# Patient Record
Sex: Female | Born: 1975
Health system: Southern US, Community
[De-identification: ages and names within clinical notes are randomized; demographics above are authoritative.]

## PROBLEM LIST (undated history)

## (undated) DIAGNOSIS — F209 Schizophrenia, unspecified: Secondary | ICD-10-CM

## (undated) DIAGNOSIS — J189 Pneumonia, unspecified organism: Secondary | ICD-10-CM

## (undated) DIAGNOSIS — F1910 Other psychoactive substance abuse, uncomplicated: Secondary | ICD-10-CM

## (undated) DIAGNOSIS — B1920 Unspecified viral hepatitis C without hepatic coma: Secondary | ICD-10-CM

## (undated) DIAGNOSIS — E119 Type 2 diabetes mellitus without complications: Secondary | ICD-10-CM

## (undated) DIAGNOSIS — F32A Depression, unspecified: Secondary | ICD-10-CM

## (undated) DIAGNOSIS — F419 Anxiety disorder, unspecified: Secondary | ICD-10-CM

## (undated) DIAGNOSIS — B999 Unspecified infectious disease: Secondary | ICD-10-CM

## (undated) DIAGNOSIS — F329 Major depressive disorder, single episode, unspecified: Secondary | ICD-10-CM

## (undated) DIAGNOSIS — G5600 Carpal tunnel syndrome, unspecified upper limb: Secondary | ICD-10-CM

## (undated) HISTORY — DX: Unspecified infectious disease: B99.9

## (undated) HISTORY — PX: CHOLECYSTECTOMY: SHX55

## (undated) HISTORY — DX: Depression, unspecified: F32.A

## (undated) HISTORY — PX: BACK SURGERY: SHX140

## (undated) HISTORY — DX: Major depressive disorder, single episode, unspecified: F32.9

## (undated) HISTORY — DX: Anxiety disorder, unspecified: F41.9

---

## 2005-03-02 ENCOUNTER — Ambulatory Visit (HOSPITAL_COMMUNITY): Admission: RE | Admit: 2005-03-02 | Discharge: 2005-03-03 | Payer: Self-pay | Admitting: Neurosurgery

## 2005-10-19 ENCOUNTER — Inpatient Hospital Stay (HOSPITAL_COMMUNITY): Admission: RE | Admit: 2005-10-19 | Discharge: 2005-10-24 | Payer: Self-pay | Admitting: Neurosurgery

## 2007-04-06 ENCOUNTER — Encounter
Admission: RE | Admit: 2007-04-06 | Discharge: 2007-04-21 | Payer: Self-pay | Admitting: Physical Medicine & Rehabilitation

## 2007-04-21 ENCOUNTER — Ambulatory Visit: Payer: Self-pay | Admitting: Physical Medicine & Rehabilitation

## 2009-11-13 ENCOUNTER — Ambulatory Visit: Payer: Self-pay | Admitting: Gastroenterology

## 2010-10-20 NOTE — Group Therapy Note (Signed)
REFERRING PHYSICIAN:  Danae Orleans. Venetia Maxon, M.D.   HISTORY:  A 35 year old female who states that she has a history of low  back pain dating to childhood.  She has had an L3-4 L4-5 right  microdiskectomy per Dr. Venetia Maxon for a herniated nucleus pulposus with  radiculopathy at that level.  She initially had good relief, but then  had a fall with re-herniation.  She then underwent an L3-4, L4-5  diskectomy, intradiskal spacing devices, and pedicle screw and rod  fixation L3 to L5 on Oct 19, 2005.  While her radicular pain has  improved, she has continued to have axial back pain.  She has been seen  by Dr. Venetia Maxon in follow-up as well as at [facility].  She  has not been to any pain management clinics.  She does not recall being  in any physical therapy postoperatively.  She was in [facility]  postoperatively.   She has been on Percocet 10/325 taking about three or four a day.  She  is really supposed to be taking three a day.  She has been on Lyrica 300  mg twice a day.  She has not had any spine injections postoperatively.   She grades her pain as 9/10.  Sleep is poor.  Pain is worse with  walking, bending, sitting, standing.  She can walk five minutes at a  time.  She walks without assistance.  She has been on disability for a  couple of years and states that she was put on it for her back pain.  She needs assistance with bathing, feeding, dressing, household duties,  and shopping.   REVIEW OF SYSTEMS:  Positive for depression, anxiety, trouble walking,  and weakness.   She has also had some weight loss.  She smokes two packs a day.  She  lives with her daughter who is 61 years old.   PAST SURGICAL HISTORY:  In addition to above, she has had  cholecystectomy in 2002.   FAMILY HISTORY:  Lung disease, diabetes, and psychiatric problems.   PHYSICAL EXAMINATION:  VITAL SIGNS:  Blood pressure 135/101, pulse 110,  respirations 20, O2 saturation 99% on room air.  ABDOMEN:  Positive bowel  sounds, soft, nontender, and no bruits.  GENERAL:  Well-developed, well-nourished female, orientation x3.  Affect  is bright.  Gait is normal.  She has no pain with internal or external  rotation of the hips.  She has full ankle and knee range of motion.  Upper extremity range of motion is normal.  Upper and lower extremity  strength is normal.  She does have some decreased full effort with knee  extension bilaterally due to back pain.  She has negative straight leg  raising.  Faber's test has some back pain, but this is more in the mid  spine area.  She has no tenderness over the spinous processes or over  the PSIS areas.   IMPRESSION:  1. Lumbar post laminectomy pain syndrome.  She has mainly axial pain,      question whether she may have some sacroiliac or facet involvement.      I have recommended diagnostic injections.  She is somewhat      apprehensive about this.  I will get urine drug screen today.  She      states that she took some type of medicine from a friend of hers      that will show positive as THC.  I question whether this was  Naranol and she did not remember the name of it and I cautioned her      that it is illegal to share controlled substances.  Also I      indicated that if she is testing positive for THC or other illicit      drugs, I will not be able to provide narcotic analgesic      prescription for her.  2. Send her to physical therapy in [facility].  3. Lidoderm patch.  4. Tramadol one to two tablets t.i.d. p.r.n. in place of the Percocet      for now.      Erick Colace, M.D.  Electronically Signed     AEK/MedQ  D:  04/21/2007 14:26:23  T:  04/22/2007 10:30:30  Job #:  [id number]   cc:   Danae Orleans. Venetia Maxon, M.D.  Fax: [phone]   Ulice Brilliant, M.D.

## 2010-10-23 NOTE — Op Note (Signed)
Veronica Thomas, Veronica Thomas                 ACCOUNT NO.:  [id number]   MEDICAL RECORD NO.:  [id number]          PATIENT TYPE:  AMB   LOCATION:  SDS                          FACILITY:  [facility]   PHYSICIAN:  Danae Orleans. Venetia Maxon, M.D.  DATE OF BIRTH:  07-02-1975   DATE OF PROCEDURE:  03/02/2005  DATE OF DISCHARGE:                                 OPERATIVE REPORT   PREOPERATIVE DIAGNOSIS:  Herniated lumbar disk at L3-4 and L4-5, right, with  spondylosis, degenerative disk disease and radiculopathy.   POSTOPERATIVE DIAGNOSIS:  Herniated lumbar disk at L3-4 and L4-5, right,  with spondylosis, degenerative disk disease and radiculopathy.   PROCEDURE:  Right L3-4 and L4-5 microdiskectomy with microdissection.   SURGEON:  Danae Orleans. Venetia Maxon, M.D.   ASSISTANT:  Stefani Dama, M.D.   ANESTHESIA:  General endotracheal anesthesia.   ESTIMATED BLOOD LOSS:  100 mL.   COMPLICATIONS:  None.   DISPOSITION:  To recovery.   INDICATIONS:  Veronica Thomas is a 35 year old young woman with a very large  herniated disk at L4-5 on the right, who has severe L5 radiculopathy.  Additionally, she has a large disk herniation at L3-4 on the right as well.  It was elected to take her to surgery for a microdiskectomy at these  affected levels.   PROCEDURE:  [name] was brought to the operating room.  Following the  satisfactory and uncomplicated induction of general endotracheal anesthesia  and placement of intravenous lines, the patient placed in the prone position  on the Wilson frame.  Her low back was then prepped and draped in the usual  sterile fashion.  The area of planned incision was infiltrated with 0.25%  Marcaine and 0.5% lidocaine and 1:200,000 epinephrine.  An incision was made  in the midline and carried through approximately two inches of adipose  tissue to the lumbar dorsal fascia, which was incised to the right side of  midline.  Subperiosteal dissection was performed exposing what was felt to  be the  L3-4 and L4-5 interspaces, and this was confirmed on intraoperative x-  ray.  Subsequently a hemisemilaminectomy of L4 and L3 were then performed on  the right side of midline with high-speed drill and completed with Kerrison  rongeurs.  Initially at the L4-5 level the ligamentum flavum was detached  and removed in a piecemeal fashion and under microdissection technique, the  L5 nerve root and thecal sac were brought medially exposing a free fragment  of herniated disk material which was quite large, which was caudad to the  interspace, with significant compression of the thecal sac and L5 nerve  root.  The herniated disk material was then removed in a piecemeal fashion.  The interspace was entered as the disk material appeared to emanate from the  interspace.  The interspace was aggressively cleared of residual disk  material using a variety of Epstein curettes and [employer] downgoing  curettes with up and down and straight-biting pituitary rongeurs.  After  this diskectomy portion of the procedure was completed at the L4-5 level, it  was felt that the interspace  was well-decompressed.  Attention was then  turned to the L3-4 level, where previous laminotomy had been performed.  The  thecal sac was mobilized.  The lateral aspect of the thecal sac appeared to  be tethered with scar tissue and a sclerotic disk herniation at the  interspace.  After mobilizing the thecal sac, the interspace was then  incised with a 15 blade and the disk material was removed in a piecemeal  fashion using again a variety of Epstein curettes, [employer]  downgoing curettes and a variety of pituitary rongeurs.  The interspace was  further evacuated of residual disk material and the thecal sac appeared to  be well-decompressed.  The wound was then copiously irrigated with  bacitracin and saline.  Soft tissues were inspected and found to be in good  repair.  The operative site was then bathed in 2 mL of  fentanyl and 80 mg of  Depo-Medrol.  The self-retaining retractor was removed, the microscope was  taken out of the field, and the lumbar dorsal fascia was closed with 0  Vicryl sutures and the subcutaneous tissue was reapproximated with 4-0  Vicryl interrupted, inverted sutures.  The skin edges were reapproximated  with interrupted 3-0 Vicryl subcuticular stitch.  The wound was dressed with  Dermabond.  The patient was extubated in the operating room and taken to the  recovery room in stable and satisfactory condition, having tolerated her  operation well.  Counts were correct at the end of the case.      Danae Orleans. Venetia Maxon, M.D.  Electronically Signed     JDS/MEDQ  D:  03/02/2005  T:  03/03/2005  Job:  [id number]

## 2010-10-23 NOTE — Discharge Summary (Signed)
[name], [name] NO.:  [id number]   MEDICAL RECORD NO.:  [id number]          PATIENT TYPE:  INP   LOCATION:  3015                         FACILITY:  [facility]   PHYSICIAN:  Stefani Dama, M.D.  DATE OF BIRTH:  16-Apr-1976   DATE OF ADMISSION:  10/19/2005  DATE OF DISCHARGE:  10/24/2005                                 DISCHARGE SUMMARY   ADMISSION DIAGNOSIS:  Recurrent herniated nucleus pulposus with spondylosis,  degenerative disk disease of L3-4 and L4-5, lumbar radiculopathy.   FINAL DIAGNOSES:  1.  Recurrent herniated nucleus pulposus with spondylosis, degenerative disk      disease of L3-4 and L4-5, lumbar radiculopathy.  2.  Morbid obesity.  3.  Adjustment disorder.   CONDITION ON DISCHARGE:  Stable.   HOSPITAL COURSE:  Ms. [name] is a 35 year old individual who was  admitted with back and lower extremity pain secondary to degenerative disk  disease and a recurrent disk herniation at the level of L4-L5.  The patient  was advised regarding surgical intervention and underwent posterior  interbody decompression and arthrodesis at L3-4 and L4-5 with pedicle screw  fixation from L3 to L5.   Postoperatively, the patient was encouraged to mobilize.  She was very slow  in doing so and required the use of a Dilaudid PCA for five days.  Initial  efforts at weaning her from the Dilaudid were unsuccessful.  The patient  appeared very poorly mobilized and very poorly motivated to increase her  ambulatory status overall.  The patient did require substantial  encouragement in efforts to mobilize herself independently.  She complained  of various problems with nursing staff and physical therapy and even  complained of Dr. Venetia Maxon and ultimately complained about my inattention to  her care.  I advised that by this time the patient should be off of  parenteral pain medication, she should be mobilizing independently, and  advised that she needed to be more involved  in her own self-care issues and  needed to motivate herself.  With this, the patient became rather angry and  distraught and wished to be discharged home.  PCA pump was discontinued  today.  The patient is started on oral Dilaudid.  If she tolerates this  medication, which I suspect she will, she will be given a prescription for  Dilaudid 2 mg, #40, without refills; Flexeril 10 mg one q.8h.  Her incision  is clean and dry.  Her motor function as observed by her gait today appears  intact in the major groups, including the iliopsoas, quads, tibialis  anterior, and the gastrocs.   FOLLOW UP:  She will be followed up by Dr. Maeola Harman in approximately two  weeks' time.      Stefani Dama, M.D.  Electronically Signed     HJE/MEDQ  D:  10/24/2005  T:  10/25/2005  Job:  [id number]

## 2010-10-23 NOTE — Op Note (Signed)
NAMECIELA, MAHAJAN NO.:  [id number]   MEDICAL RECORD NO.:  [id number]          PATIENT TYPE:  INP   LOCATION:  2899                         FACILITY:  [facility]   PHYSICIAN:  Danae Orleans. Venetia Maxon, M.D.  DATE OF BIRTH:  [birth year]   DATE OF PROCEDURE:  10/19/2005  DATE OF DISCHARGE:                                 OPERATIVE REPORT   PREOPERATIVE DIAGNOSIS:  Recurrent herniated lumbar disk with spondylosis,  degenerative disk disease and radiculopathy at L3-4 and L4-5 levels.   POSTOPERATIVE DIAGNOSIS:  Recurrent herniated lumbar disk with spondylosis,  degenerative disk disease and radiculopathy at L3-4 and L4-5 levels.   PROCEDURE:  Redo diskectomy, L3-4 and L4-5, with posterior lumbar interbody  fusion, L3-4 and L4-5, with morcelized bone autograft and Osteocel with  pedicle screw fixation and posterolateral arthrodesis.   SURGEON:  Danae Orleans. Venetia Maxon, M.D.   ASSISTANT:  Hilda Lias, M.D.   ANESTHESIA:  General endotracheal anesthesia.   ESTIMATED BLOOD LOSS:  900 mL with 500 mL of Cell Saver blood returned to  the patient.   COMPLICATIONS:  None.   DISPOSITION:  To Recovery.   INDICATION:  Shailah Gibbins is a 35 year old woman who had previously undergone  lumbar diskectomy.  She is a morbidly obese smoker.  She has had recurrent  disk herniation and complains of significant low back and lower extremity  pain.  It was elected to take her to surgery for redo decompression and  fusion at these affected levels.   DESCRIPTION OF PROCEDURE:  Ms. Walsworth was brought to the operating room.  Following the satisfactory and uncomplicated induction of general  endotracheal anesthesia and placement of intravenous line and a Foley  catheter, the patient was placed in a prone position on the operating room.  Her low back was then prepped and draped in the usual sterile fashion.  The  area of planned incision was infiltrated with 0.25% Marcaine and 0.5%  lidocaine and  1:200,000 epinephrine.  Incision was made and carried through  copious adipose tissue and the lumbodorsal fascia was incised bilaterally.  Subperiosteal dissection was performed, exposing the L3, L4 and L5  transverse processes bilaterally and laminar-facet complexes at L3-4 and L4-  5 levels.  Intraoperative x-ray confirmed correct orientation at these  levels.  Subsequently, redo laminectomy and diskectomy was performed on the  right at the L3-4 and L4-5 level.  Using loupe magnification and very  carefully dissecting through investing scar tissue, multiple fragments of  herniated disk material were removed from both the L3-4 and L4-5 levels.  Subsequently, on the left, laminectomy of L3 and L4 was then performed and  diskectomy was performed at this level as well.  Interbody trial spacers  were then placed, 10 mm in size, and a more thorough diskectomy was  performed, both from the right and from the left to thoroughly remove disk  material and strip cartilaginous endplates to bleeding bone.  After trial  sizing, 10-mm Peek interbody cages were selected, packed with morcelized  bone autograft and Osteocel, which were then tamped into position.  Additional bone  graft material was packed in the interspace at each level  and then a second 10-mm cage was packed with morcelized bone autograft and  inserted in the interspace at each of these levels.  The positioning of  implants were satisfactory on the AP and lateral fluoroscopy.  Subsequently,  a 6.5 x 40-mm screws were placed at L5 and L4 bilaterally without any  cutouts and then at the L3 level, 45-mm pedicle screws were placed.  The  transverse processes of L3, L4 and L5 were decorticated with a high-speed  drill and morcelized bone autograft and the remaining Osteocel was then  placed overlying the transverse processes at each of these levels.  Seventy-  millimeter pre-lordosed rods were then placed and locked down in situ at  each of  these levels over each screw and prior to placing the bone graft,  the wound was copiously irrigated with Bacitracin and saline.  Soft tissues  were inspected and found to be in good repair.  The self-retaining retractor  was removed.  The lumbodorsal fascia was closed with #1 Vicryl sutures,  subcutaneous tissues were reapproximated with 2-0 Vicryl interrupted and  inverted sutures and the skin edges were reapproximated with interrupted 3-0  Vicryl subcuticular stitch.  The wound was dressed with Benzoin, Steri-  Strips, Telfa gauze and tape.  The patient was extubated in the operating  room and taken to the recovery room in stable and satisfactory condition,  having tolerated her operation well.  Counts were correct at the end of the  case.      Danae Orleans. Venetia Maxon, M.D.  Electronically Signed     JDS/MEDQ  D:  10/19/2005  T:  10/20/2005  Job:  [id number]

## 2011-02-02 LAB — HEPATITIS B SURFACE ANTIGEN: Hepatitis B Surface Ag: NEGATIVE

## 2011-02-02 LAB — ANTIBODY SCREEN: Antibody Screen: NEGATIVE

## 2011-02-02 LAB — RPR: RPR: NONREACTIVE

## 2011-06-08 NOTE — L&D Delivery Note (Signed)
Delivery Note At 2:52 PM a viable female was delivered via  (Presentation: Right Occiput Anterior).  APGAR: , ; weight .   Placenta status: Intact, Spontaneous.  Cord: 3 vessels with the following complications: None.   Bleeding c/w partial placental separation immediately after birth. Grasped body of Duncan placenta in cx and delivered intact by controlled traction. Manual exploration x2 -> large clots. FF and hemostatic after repair.   Anesthesia: Epidural Intrathecal  Episiotomy: None Lacerations: 1st degree Suture Repair: 3.0 vicryl rapide Est. Blood Loss (mL): 500cc  Mom to postpartum.  Baby to nursery-stable.  Veronica Thomas 07/31/2011, 3:23 PM

## 2011-07-15 ENCOUNTER — Other Ambulatory Visit (HOSPITAL_COMMUNITY)
Admission: RE | Admit: 2011-07-15 | Discharge: 2011-07-15 | Disposition: A | Discharge status: Home / Self Care | Payer: Medicare Other | Source: Ambulatory Visit | Attending: Family Medicine | Admitting: Family Medicine

## 2011-07-15 ENCOUNTER — Ambulatory Visit (INDEPENDENT_AMBULATORY_CARE_PROVIDER_SITE_OTHER): Payer: Medicare Other | Admitting: Obstetrics & Gynecology

## 2011-07-15 VITALS — BP 120/85 | Temp 98.5°F | Ht 68.5 in | Wt 229.7 lb

## 2011-07-15 DIAGNOSIS — F489 Nonpsychotic mental disorder, unspecified: Secondary | ICD-10-CM

## 2011-07-15 DIAGNOSIS — F1910 Other psychoactive substance abuse, uncomplicated: Secondary | ICD-10-CM

## 2011-07-15 DIAGNOSIS — O99320 Drug use complicating pregnancy, unspecified trimester: Secondary | ICD-10-CM

## 2011-07-15 DIAGNOSIS — Z124 Encounter for screening for malignant neoplasm of cervix: Secondary | ICD-10-CM | POA: Insufficient documentation

## 2011-07-15 DIAGNOSIS — O99340 Other mental disorders complicating pregnancy, unspecified trimester: Secondary | ICD-10-CM

## 2011-07-15 DIAGNOSIS — Z7251 High risk heterosexual behavior: Secondary | ICD-10-CM

## 2011-07-15 DIAGNOSIS — Z113 Encounter for screening for infections with a predominantly sexual mode of transmission: Secondary | ICD-10-CM | POA: Insufficient documentation

## 2011-07-15 LAB — STREP B DNA PROBE: GBS: POSITIVE

## 2011-07-15 LAB — CBC
MCH: 29.7 pg (ref 26.0–34.0)
MCV: 84.3 fL (ref 78.0–100.0)
RBC: 4.85 MIL/uL (ref 3.87–5.11)
RDW: 13.9 % (ref 11.5–15.5)
WBC: 12.8 10*3/uL — ABNORMAL HIGH (ref 4.0–10.5)

## 2011-07-15 LAB — POCT URINALYSIS DIP (DEVICE)
Glucose, UA: NEGATIVE mg/dL
Hgb urine dipstick: NEGATIVE
Nitrite: NEGATIVE
Specific Gravity, Urine: >=1.03 (ref 1.005–1.030)
Urobilinogen, UA: 4 mg/dL — ABNORMAL HIGH (ref 0.0–1.0)

## 2011-07-15 NOTE — Progress Notes (Signed)
Addended by: Jill Side on: 07/15/2011 12:40 PM   Modules accepted: Orders
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
## 2011-07-15 NOTE — Progress Notes (Signed)
   Subjective:    Veronica Thomas is a [id number] [redacted]w[redacted]d being seen today for her first obstetrical visit.  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 Patient does intend to breast feed. Pregnancy history fully reviewed.  Patient reports nausea and occasional contractions.  Filed Vitals:   07/15/11 1126 07/15/11 1128  BP: 120/85   Temp: 98.5 F (36.9 C)   Height:  5' 8.5" (1.74 m)  Weight: 229 lb 11.2 oz (104.191 kg)     HISTORY: OB History    Grav Para Term Preterm Abortions TAB SAB Ect Mult Living   4 1 1  0 2 1 1  0 0 1     # Outc Date GA Lbr Len/2nd Wgt Sex Del Anes PTL Lv   1 TAB 1996           2 TRM 10/98 [redacted]w[redacted]d  8lb2oz(3.685kg) F SVD None  Yes   Comments: postpartum hemorrhage   3 SAB 2005           4 CUR              Past Medical History  Diagnosis Date  . Anxiety   . Depression   . Infection     hepatitis c   Past Surgical History  Procedure Date  . Cholecystectomy   . Back surgery     , hard ware L3,4,5   Family History  Problem Relation Age of Onset  . Diabetes Mother   . Diabetes Father      Exam    Uterine Size: 36 cm  Pelvic Exam:    Perineum: Hemorrhoids, Normal Perineum   Vulva: normal   Vagina:  normal mucosa, normal discharge   pH:    Cervix: no lesions   Adnexa: not evaluated   Bony Pelvis: average  System: Breast:  normal appearance, no masses or tenderness   Skin: normal coloration and turgor, no rashes    Neurologic: oriented, normal   Extremities: no musculoskeletal defects noted   HEENT    Mouth/Teeth mucous membranes moist, pharynx normal without lesions   Neck supple   Cardiovascular:    Respiratory:  appears well, vitals normal, no respiratory distress, acyanotic, normal RR   Abdomen: gravid not tender   Urinary: urethral meatus normal      Assessment:    Pregnancy: [id number] There is no problem list on file for this patient.       Plan:     Initial labs drawn. Prenatal vitamins. Problem  list reviewed and updated. Genetic Screening discussed Amniocentesis: declined.  Ultrasound discussed; fetal survey: declined.  Follow up in 1 weeks. 50% of 30 min visit spent on counseling and coordination of care.     ARNOLD,JAMES 07/15/2011

## 2011-07-15 NOTE — Patient Instructions (Signed)
Pregnancy - Third Trimester The third trimester of pregnancy (the last 3 months) is a period of the most rapid growth for you and your baby. The baby approaches a length of 20 inches and a weight of 6 to 10 pounds. The baby is adding on fat and getting ready for life outside your body. While inside, babies have periods of sleeping and waking, suck their thumbs, and hiccups. You can often feel small contractions of the uterus. This is false labor. It is also called Braxton-Hicks contractions. This is like a practice for labor. The usual problems in this stage of pregnancy include more difficulty breathing, swelling of the hands and feet from water retention, and having to urinate more often because of the uterus and baby pressing on your bladder.  PRENATAL EXAMS  Blood work may continue to be done during prenatal exams. These tests are done to check on your health and the probable health of your baby. Blood work is used to follow your blood levels (hemoglobin). Anemia (low hemoglobin) is common during pregnancy. Iron and vitamins are given to help prevent this. You may also continue to be checked for diabetes. Some of the past blood tests may be done again.   The size of the uterus is measured during each visit. This makes sure your baby is growing properly according to your pregnancy dates.   Your blood pressure is checked every prenatal visit. This is to make sure you are not getting toxemia.   Your urine is checked every prenatal visit for infection, diabetes and protein.   Your weight is checked at each visit. This is done to make sure gains are happening at the suggested rate and that you and your baby are growing normally.   Sometimes, an ultrasound is performed to confirm the position and the proper growth and development of the baby. This is a test done that bounces harmless sound waves off the baby so your caregiver can more accurately determine due dates.   Discuss the type of pain  medication and anesthesia you will have during your labor and delivery.   Discuss the possibility and anesthesia if a Cesarean Section might be necessary.   Inform your caregiver if there is any mental or physical violence at home.  Sometimes, a specialized non-stress test, contraction stress test and biophysical profile are done to make sure the baby is not having a problem. Checking the amniotic fluid surrounding the baby is called an amniocentesis. The amniotic fluid is removed by sticking a needle into the belly (abdomen). This is sometimes done near the end of pregnancy if an early delivery is required. In this case, it is done to help make sure the baby's lungs are mature enough for the baby to live outside of the womb. If the lungs are not mature and it is unsafe to deliver the baby, an injection of cortisone medication is given to the mother 1 to 2 days before the delivery. This helps the baby's lungs mature and makes it safer to deliver the baby. CHANGES OCCURING IN THE THIRD TRIMESTER OF PREGNANCY Your body goes through many changes during pregnancy. They vary from person to person. Talk to your caregiver about changes you notice and are concerned about.  During the last trimester, you have probably had an increase in your appetite. It is normal to have cravings for certain foods. This varies from person to person and pregnancy to pregnancy.   You may begin to get stretch marks on your hips,   abdomen, and breasts. These are normal changes in the body during pregnancy. There are no exercises or medications to take which prevent this change.   Constipation may be treated with a stool softener or adding bulk to your diet. Drinking lots of fluids, fiber in vegetables, fruits, and whole grains are helpful.   Exercising is also helpful. If you have been very active up until your pregnancy, most of these activities can be continued during your pregnancy. If you have been less active, it is helpful  to start an exercise program such as walking. Consult your caregiver before starting exercise programs.   Avoid all smoking, alcohol, un-prescribed drugs, herbs and "street drugs" during your pregnancy. These chemicals affect the formation and growth of the baby. Avoid chemicals throughout the pregnancy to ensure the delivery of a healthy infant.   Backache, varicose veins and hemorrhoids may develop or get worse.   You will tire more easily in the third trimester, which is normal.   The baby's movements may be stronger and more often.   You may become short of breath easily.   Your belly button may stick out.   A yellow discharge may leak from your breasts called colostrum.   You may have a bloody mucus discharge. This usually occurs a few days to a week before labor begins.  HOME CARE INSTRUCTIONS   Keep your caregiver's appointments. Follow your caregiver's instructions regarding medication use, exercise, and diet.   During pregnancy, you are providing food for you and your baby. Continue to eat regular, well-balanced meals. Choose foods such as meat, fish, milk and other low fat dairy products, vegetables, fruits, and whole-grain breads and cereals. Your caregiver will tell you of the ideal weight gain.   A physical sexual relationship may be continued throughout pregnancy if there are no other problems such as early (premature) leaking of amniotic fluid from the membranes, vaginal bleeding, or belly (abdominal) pain.   Exercise regularly if there are no restrictions. Check with your caregiver if you are unsure of the safety of your exercises. Greater weight gain will occur in the last 2 trimesters of pregnancy. Exercising helps:   Control your weight.   Get you in shape for labor and delivery.   You lose weight after you deliver.   Rest a lot with legs elevated, or as needed for leg cramps or low back pain.   Wear a good support or jogging bra for breast tenderness during  pregnancy. This may help if worn during sleep. Pads or tissues may be used in the bra if you are leaking colostrum.   Do not use hot tubs, steam rooms, or saunas.   Wear your seat belt when driving. This protects you and your baby if you are in an accident.   Avoid raw meat, cat litter boxes and soil used by cats. These carry germs that can cause birth defects in the baby.   It is easier to loose urine during pregnancy. Tightening up and strengthening the pelvic muscles will help with this problem. You can practice stopping your urination while you are going to the bathroom. These are the same muscles you need to strengthen. It is also the muscles you would use if you were trying to stop from passing gas. You can practice tightening these muscles up 10 times a set and repeating this about 3 times per day. Once you know what muscles to tighten up, do not perform these exercises during urination. It is more likely   to cause an infection by backing up the urine.   Ask for help if you have financial, counseling or nutritional needs during pregnancy. Your caregiver will be able to offer counseling for these needs as well as refer you for other special needs.   Make a list of emergency phone numbers and have them available.   Plan on getting help from family or friends when you go home from the hospital.   Make a trial run to the hospital.   Take prenatal classes with the father to understand, practice and ask questions about the labor and delivery.   Prepare the baby's room/nursery.   Do not travel out of the city unless it is absolutely necessary and with the advice of your caregiver.   Wear only low or no heal shoes to have better balance and prevent falling.  MEDICATIONS AND DRUG USE IN PREGNANCY  Take prenatal vitamins as directed. The vitamin should contain 1 milligram of folic acid. Keep all vitamins out of reach of children. Only a couple vitamins or tablets containing iron may be fatal  to a baby or young child when ingested.   Avoid use of all medications, including herbs, over-the-counter medications, not prescribed or suggested by your caregiver. Only take over-the-counter or prescription medicines for pain, discomfort, or fever as directed by your caregiver. Do not use aspirin, ibuprofen (Motrin, Advil, Nuprin) or naproxen (Aleve) unless OK'd by your caregiver.   Let your caregiver also know about herbs you may be using.   Alcohol is related to a number of birth defects. This includes fetal alcohol syndrome. All alcohol, in any form, should be avoided completely. Smoking will cause low birth rate and premature babies.   Street/illegal drugs are very harmful to the baby. They are absolutely forbidden. A baby born to an addicted mother will be addicted at birth. The baby will go through the same withdrawal an adult does.  SEEK MEDICAL CARE IF: You have any concerns or worries during your pregnancy. It is better to call with your questions if you feel they cannot wait, rather than worry about them. DECISIONS ABOUT CIRCUMCISION You may or may not know the sex of your baby. If you know your baby is a boy, it may be time to think about circumcision. Circumcision is the removal of the foreskin of the penis. This is the skin that covers the sensitive end of the penis. There is no proven medical need for this. Often this decision is made on what is popular at the time or based upon religious beliefs and social issues. You can discuss these issues with your caregiver or pediatrician. SEEK IMMEDIATE MEDICAL CARE IF:   An unexplained oral temperature above 102 F (38.9 C) develops, or as your caregiver suggests.   You have leaking of fluid from the vagina (birth canal). If leaking membranes are suspected, take your temperature and tell your caregiver of this when you call.   There is vaginal spotting, bleeding or passing clots. Tell your caregiver of the amount and how many pads are  used.   You develop a bad smelling vaginal discharge with a change in the color from clear to white.   You develop vomiting that lasts more than 24 hours.   You develop chills or fever.   You develop shortness of breath.   You develop burning on urination.   You loose more than 2 pounds of weight or gain more than 2 pounds of weight or as suggested by your   caregiver.   You notice sudden swelling of your face, hands, and feet or legs.   You develop belly (abdominal) pain. Round ligament discomfort is a common non-cancerous (benign) cause of abdominal pain in pregnancy. Your caregiver still must evaluate you.   You develop a severe headache that does not go away.   You develop visual problems, blurred or double vision.   If you have not felt your baby move for more than 1 hour. If you think the baby is not moving as much as usual, eat something with sugar in it and lie down on your left side for an hour. The baby should move at least 4 to 5 times per hour. Call right away if your baby moves less than that.   You fall, are in a car accident or any kind of trauma.   There is mental or physical violence at home.  Document Released: 05/18/2001 Document Revised: 02/03/2011 Document Reviewed: 11/20/2008 [facility] Patient Information 2012 [place], [place]. Pregnancy and Smoking Smoking during pregnancy is very unhealthy for the mother and the developing fetus. The addictive drug in cigarettes (nicotine), carbon monoxide, and many other poisons are inhaled from a cigarette and are carried through your bloodstream to your fetus. Cigarette smoke contains more than 2,500 chemicals. It is not known which of these chemicals are harmful to the developing fetus. However, both nicotine and carbon monoxide play a role in causing health problems in pregnancy. Effects on the fetus of smoking during pregnancy:  Decrease in blood flow and oxygen to the uterus, placenta, and your fetus.   Increased heart  rate of the fetus.   Slowing of your fetus's growth in the uterus (intrauterine growth retardation).   Placental problems. Placenta may partially cover or completely cover the opening to the cervix (placenta previa), or the placenta may partially or completely separate from the uterus (placental abruption).   Increase risk of pregnancy outside of the uterus (tubal pregnancy).   Premature rupture membranes, causing the sac that holds the fetus to break too early, resulting in premature birth and increased health risks to the newborn.   Increased risk of birth defects, including heart defects.   Increased risk of miscarriage.  Newborns born to women who smoke during pregnancy:  Are more likely to be born too early (prematurely).   Are more likely to be at a low birth weight.   Are at risk for serious health problems, chronic or lifelong disabilities (cerebral palsy, mental retardation, learning problems), and possibly even death   Are at risk of Sudden Infant Death Syndrome (SIDS).   Have higher rates of miscarriage and stillbirth.   Have more lung and breathing (respiratory) problems.  Long-term effects on a child's behavior: Some of the following trends are seen with children of smoking mothers:  Increased risk for drug abuse, behavior, and conduct disorders.   Increased risk for smoking in adolescent girls.   Increased risk for negative behavior in 2-year-olds.   Increase risk for asthma, colic, and childhood obesity, which can lead to diabetes.   Increased risk for finger and toe disorders.  Resources to stop smoking during pregnancy:  Counseling.   Psychological treatment.   Acupuncture.   Family intervention.   Hypnosis.   Medicines that are safe to take during pregnancy. Nicotine supplements have not been studied enough. They should only be considered when all other methods fail.   Telephone QUIT lines.  Smoking and Breastfeeding:  Nicotine gets passed to  the infant through a  mother's breastmilk. This can cause nausea, colic, cramping, and diarrhea in the infant.   Smoking may reduce milk supply and interfere with the let-down response.   Even formula-fed infants of mothers who smoke have nicotine and cotinine (nicotine by-product) in their urine.  Other resources to help stop smoking:  American Cancer Society: www.cancer.org   American Heart Association: www.americanheart.org   National Cancer Institute: www.cancer.gov   Smoke Free Families: www.smokefreefamilies.[address] [place]): 769-703-5877 START  Document Released: 10/05/2004 Document Revised: 02/03/2011 Document Reviewed: 03/05/2009 [facility] Patient Information 2012 [place], [place]. Breastfeeding BENEFITS OF BREASTFEEDING For the baby  The first milk (colostrum) helps the baby's digestive system function better.   There are antibodies from the mother in the milk that help the baby fight off infections.   The baby has a lower incidence of asthma, allergies, and SIDS (sudden infant death syndrome).   The nutrients in breast milk are better than formulas for the baby and helps the baby's brain grow better.   Babies who breastfeed have less gas, colic, and constipation.  For the mother  Breastfeeding helps develop a very special bond between mother and baby.   It is more convenient, always available at the correct temperature and cheaper than formula feeding.   It burns calories in the mother and helps with losing weight that was gained during pregnancy.   It makes the uterus contract back down to normal size faster and slows bleeding following delivery.   Breastfeeding mothers have a lower risk of developing breast cancer.  NURSE FREQUENTLY  A healthy, full-term baby may breastfeed as often as every hour or space his or her feedings to every 3 hours.   How often to nurse will vary from baby to baby. Watch your baby for signs of hunger, not the clock.   Nurse  as often as the baby requests, or when you feel the need to reduce the fullness of your breasts.   Awaken the baby if it has been 3 to 4 hours since the last feeding.   Frequent feeding will help the mother make more milk and will prevent problems like sore nipples and engorgement of the breasts.  BABY'S POSITION AT THE BREAST  Whether lying down or sitting, be sure that the baby's tummy is facing your tummy.   Support the breast with 4 fingers underneath the breast and the thumb above. Make sure your fingers are well away from the nipple and baby's mouth.   Stroke the baby's lips and cheek closest to the breast gently with your finger or nipple.   When the baby's mouth is open wide enough, place all of your nipple and as much of the dark area around the nipple as possible into your baby's mouth.   Pull the baby in close so the tip of the nose and the baby's cheeks touch the breast during the feeding.  FEEDINGS  The length of each feeding varies from baby to baby and from feeding to feeding.   The baby must suck about 2 to 3 minutes for your milk to get to him or her. This is called a "let down." For this reason, allow the baby to feed on each breast as long as he or she wants. Your baby will end the feeding when he or she has received the right balance of nutrients.   To break the suction, put your finger into the corner of the baby's mouth and slide it between his or her gums  before removing your breast from his or her mouth. This will help prevent sore nipples.  REDUCING BREAST ENGORGEMENT  In the first week after your baby is born, you may experience signs of breast engorgement. When breasts are engorged, they feel heavy, warm, full, and may be tender to the touch. You can reduce engorgement if you:   Nurse frequently, every 2 to 3 hours. Mothers who breastfeed early and often have fewer problems with engorgement.   Place light ice packs on your breasts between feedings. This reduces  swelling. Wrap the ice packs in a lightweight towel to protect your skin.   Apply moist hot packs to your breast for 5 to 10 minutes before each feeding. This increases circulation and helps the milk flow.   Gently massage your breast before and during the feeding.   Make sure that the baby empties at least one breast at every feeding before switching sides.   Use a breast pump to empty the breasts if your baby is sleepy or not nursing well. You may also want to pump if you are returning to work or or you feel you are getting engorged.   Avoid bottle feeds, pacifiers or supplemental feedings of water or juice in place of breastfeeding.   Be sure the baby is latched on and positioned properly while breastfeeding.   Prevent fatigue, stress, and anemia.   Wear a supportive bra, avoiding underwire styles.   Eat a balanced diet with enough fluids.  If you follow these suggestions, your engorgement should improve in 24 to 48 hours. If you are still experiencing difficulty, call your lactation consultant or caregiver. IS MY BABY GETTING ENOUGH MILK? Sometimes, mothers worry about whether their babies are getting enough milk. You can be assured that your baby is getting enough milk if:  The baby is actively sucking and you hear swallowing.   The baby nurses at least 8 to 12 times in a 24 hour time period. Nurse your baby until he or she unlatches or falls asleep at the first breast (at least 10 to 20 minutes), then offer the second side.   The baby is wetting 5 to 6 disposable diapers (6 to 8 cloth diapers) in a 24 hour period by 64 to 86 days of age.   The baby is having at least 2 to 3 stools every 24 hours for the first few months. Breast milk is all the food your baby needs. It is not necessary for your baby to have water or formula. In fact, to help your breasts make more milk, it is best not to give your baby supplemental feedings during the early weeks.   The stool should be soft and  yellow.   The baby should gain 4 to 7 ounces per week after he is 16 days old.  TAKE CARE OF YOURSELF Take care of your breasts by:  Bathing or showering daily.   Avoiding the use of soaps on your nipples.   Start feedings on your left breast at one feeding and on your right breast at the next feeding.   You will notice an increase in your milk supply 2 to 5 days after delivery. You may feel some discomfort from engorgement, which makes your breasts very firm and often tender. Engorgement "peaks" out within 24 to 48 hours. In the meantime, apply warm moist towels to your breasts for 5 to 10 minutes before feeding. Gentle massage and expression of some milk before feeding will soften your breasts,  making it easier for your baby to latch on. Wear a well fitting nursing bra and air dry your nipples for 10 to 15 minutes after each feeding.   Only use cotton bra pads.   Only use pure lanolin on your nipples after nursing. You do not need to wash it off before nursing.  Take care of yourself by:   Eating well-balanced meals and nutritious snacks.   Drinking milk, fruit juice, and water to satisfy your thirst (about 8 glasses a day).   Getting plenty of rest.   Increasing calcium in your diet (1200 mg a day).   Avoiding foods that you notice affect the baby in a bad way.  SEEK MEDICAL CARE IF:   You have any questions or difficulty with breastfeeding.   You need help.   You have a hard, red, sore area on your breast, accompanied by a fever of 100.5 F (38.1 C) or more.   Your baby is too sleepy to eat well or is having trouble sleeping.   Your baby is wetting less than 6 diapers per day, by 61 days of age.   Your baby's skin or white part of his or her eyes is more yellow than it was in the hospital.   You feel depressed.  Document Released: 05/24/2005 Document Revised: 02/03/2011 Document Reviewed: 01/06/2009 [facility] Patient Information 2012 [place], [place].

## 2011-07-16 LAB — DRUGS OF ABUSE SCREEN W/O ALC, ROUTINE URINE
Benzodiazepines.: NEGATIVE
Methadone: POSITIVE — AB
Propoxyphene: NEGATIVE

## 2011-07-17 LAB — CULTURE, OB URINE

## 2011-07-19 LAB — CULTURE, BETA STREP (GROUP B ONLY)

## 2011-07-20 LAB — METHADONE, CONFIRMATION: Methadone GC/MS Conf: 7787 NG/ML — ABNORMAL HIGH

## 2011-07-20 LAB — CANNABINOIDS CONFIRMATION, URINE: Carboxy Acid THC: 767 NG/ML — ABNORMAL HIGH

## 2011-07-29 ENCOUNTER — Encounter: Payer: Self-pay | Admitting: Family Medicine

## 2011-07-31 ENCOUNTER — Encounter (HOSPITAL_COMMUNITY): Payer: Self-pay | Admitting: Anesthesiology

## 2011-07-31 ENCOUNTER — Inpatient Hospital Stay (HOSPITAL_COMMUNITY)
Admission: AD | Admit: 2011-07-31 | Discharge: 2011-08-02 | DRG: 774 | Disposition: A | Discharge status: Home / Self Care | Payer: Medicare Other | Source: Ambulatory Visit | Attending: Obstetrics & Gynecology | Admitting: Obstetrics & Gynecology

## 2011-07-31 ENCOUNTER — Encounter (HOSPITAL_COMMUNITY): Payer: Self-pay | Admitting: *Deleted

## 2011-07-31 ENCOUNTER — Inpatient Hospital Stay (HOSPITAL_COMMUNITY): Payer: Medicare Other | Admitting: Anesthesiology

## 2011-07-31 DIAGNOSIS — Z22330 Carrier of Group B streptococcus: Secondary | ICD-10-CM

## 2011-07-31 DIAGNOSIS — O878 Other venous complications in the puerperium: Secondary | ICD-10-CM

## 2011-07-31 DIAGNOSIS — O4590 Premature separation of placenta, unspecified, unspecified trimester: Principal | ICD-10-CM | POA: Diagnosis present

## 2011-07-31 DIAGNOSIS — O99320 Drug use complicating pregnancy, unspecified trimester: Secondary | ICD-10-CM

## 2011-07-31 DIAGNOSIS — O99324 Drug use complicating childbirth: Secondary | ICD-10-CM

## 2011-07-31 DIAGNOSIS — K649 Unspecified hemorrhoids: Secondary | ICD-10-CM | POA: Diagnosis present

## 2011-07-31 DIAGNOSIS — F1920 Other psychoactive substance dependence, uncomplicated: Secondary | ICD-10-CM | POA: Diagnosis present

## 2011-07-31 DIAGNOSIS — F1120 Opioid dependence, uncomplicated: Secondary | ICD-10-CM | POA: Diagnosis present

## 2011-07-31 DIAGNOSIS — O99892 Other specified diseases and conditions complicating childbirth: Secondary | ICD-10-CM | POA: Diagnosis present

## 2011-07-31 HISTORY — DX: Other psychoactive substance abuse, uncomplicated: F19.10

## 2011-07-31 HISTORY — DX: Schizophrenia, unspecified: F20.9

## 2011-07-31 LAB — CBC
MCH: 29 pg (ref 26.0–34.0)
MCHC: 34.7 g/dL (ref 30.0–36.0)
MCV: 83.7 fL (ref 78.0–100.0)
Platelets: 207 10*3/uL (ref 150–400)
RDW: 14.4 % (ref 11.5–15.5)

## 2011-07-31 LAB — RAPID URINE DRUG SCREEN, HOSP PERFORMED
Barbiturates: NOT DETECTED
Benzodiazepines: NOT DETECTED

## 2011-07-31 LAB — GC/CHLAMYDIA PROBE AMP, GENITAL

## 2011-07-31 MED ORDER — FLUOXETINE HCL 20 MG PO CAPS
40.0000 mg | ORAL_CAPSULE | Freq: Every day | ORAL | Status: DC
  Administered 2011-07-31 – 2011-08-02 (×3): 40 mg via ORAL
  Filled 2011-07-31 (×4): qty 2

## 2011-07-31 MED ORDER — TETANUS-DIPHTH-ACELL PERTUSSIS 5-2.5-18.5 LF-MCG/0.5 IM SUSP
0.5000 mL | Freq: Once | INTRAMUSCULAR | Status: AC
Start: 2011-08-01 — End: 2011-08-01
  Administered 2011-08-01: 0.5 mL via INTRAMUSCULAR
  Filled 2011-07-31: qty 0.5

## 2011-07-31 MED ORDER — PENICILLIN G POTASSIUM 5000000 UNITS IJ SOLR: 5.0000 10*6.[IU] | Freq: Once | INTRAVENOUS | Status: DC

## 2011-07-31 MED ORDER — ZOLPIDEM TARTRATE 5 MG PO TABS
5.0000 mg | ORAL_TABLET | Freq: Every evening | ORAL | Status: DC | PRN
  Administered 2011-08-01: 5 mg via ORAL
  Filled 2011-07-31: qty 1

## 2011-07-31 MED ORDER — EPHEDRINE 5 MG/ML INJ
10.0000 mg | INTRAVENOUS | Status: DC | PRN
  Filled 2011-07-31: qty 4

## 2011-07-31 MED ORDER — TERBUTALINE SULFATE 1 MG/ML IJ SOLN: 0.2500 mg | Freq: Once | INTRAMUSCULAR | Status: DC | PRN

## 2011-07-31 MED ORDER — OXYTOCIN 20 UNITS IN LACTATED RINGERS INFUSION - SIMPLE
1.0000 m[IU]/min | INTRAVENOUS | Status: DC
  Administered 2011-07-31: 2 m[IU]/min via INTRAVENOUS
  Filled 2011-07-31: qty 1000

## 2011-07-31 MED ORDER — LIDOCAINE HCL (PF) 1 % IJ SOLN
30.0000 mL | INTRAMUSCULAR | Status: DC | PRN
  Filled 2011-07-31: qty 30

## 2011-07-31 MED ORDER — SIMETHICONE 80 MG PO CHEW: 80.0000 mg | CHEWABLE_TABLET | ORAL | Status: DC | PRN

## 2011-07-31 MED ORDER — OXYTOCIN 20 UNITS IN LACTATED RINGERS INFUSION - SIMPLE: 125.0000 mL/h | Freq: Once | INTRAVENOUS | Status: DC

## 2011-07-31 MED ORDER — IBUPROFEN 600 MG PO TABS
600.0000 mg | ORAL_TABLET | Freq: Four times a day (QID) | ORAL | Status: DC
  Administered 2011-07-31 – 2011-08-02 (×6): 600 mg via ORAL
  Filled 2011-07-31 (×6): qty 1

## 2011-07-31 MED ORDER — EPHEDRINE 5 MG/ML INJ: 10.0000 mg | INTRAVENOUS | Status: DC | PRN

## 2011-07-31 MED ORDER — ONDANSETRON HCL 4 MG/2ML IJ SOLN: 4.0000 mg | INTRAMUSCULAR | Status: DC | PRN

## 2011-07-31 MED ORDER — OXYTOCIN BOLUS FROM INFUSION
500.0000 mL | Freq: Once | INTRAVENOUS | Status: AC
  Administered 2011-07-31: 500 mL via INTRAVENOUS
  Filled 2011-07-31: qty 500

## 2011-07-31 MED ORDER — FLUOXETINE HCL 40 MG PO CAPS: 40.0000 mg | ORAL_CAPSULE | Freq: Every day | ORAL | Status: DC

## 2011-07-31 MED ORDER — PHENYLEPHRINE 40 MCG/ML (10ML) SYRINGE FOR IV PUSH (FOR BLOOD PRESSURE SUPPORT): 80.0000 ug | PREFILLED_SYRINGE | INTRAVENOUS | Status: DC | PRN

## 2011-07-31 MED ORDER — METHADONE HCL 10 MG PO TABS
140.0000 mg | ORAL_TABLET | Freq: Every day | ORAL | Status: DC
  Administered 2011-08-01 – 2011-08-02 (×2): 140 mg via ORAL
  Filled 2011-07-31 (×2): qty 14

## 2011-07-31 MED ORDER — PRENATAL MULTIVITAMIN CH
1.0000 | ORAL_TABLET | Freq: Every day | ORAL | Status: DC
  Administered 2011-07-31 – 2011-08-02 (×3): 1 via ORAL
  Filled 2011-07-31 (×3): qty 1

## 2011-07-31 MED ORDER — ACETAMINOPHEN 325 MG PO TABS: 650.0000 mg | ORAL_TABLET | ORAL | Status: DC | PRN

## 2011-07-31 MED ORDER — LACTATED RINGERS IV SOLN: 500.0000 mL | Freq: Once | INTRAVENOUS | Status: DC

## 2011-07-31 MED ORDER — PHENYLEPHRINE 40 MCG/ML (10ML) SYRINGE FOR IV PUSH (FOR BLOOD PRESSURE SUPPORT)
80.0000 ug | PREFILLED_SYRINGE | INTRAVENOUS | Status: DC | PRN
  Filled 2011-07-31: qty 5

## 2011-07-31 MED ORDER — PENICILLIN G POTASSIUM 5000000 UNITS IJ SOLR
2.5000 10*6.[IU] | INTRAVENOUS | Status: DC
  Administered 2011-07-31: 2.5 10*6.[IU] via INTRAVENOUS
  Filled 2011-07-31 (×6): qty 2.5

## 2011-07-31 MED ORDER — MISOPROSTOL 200 MCG PO TABS
ORAL_TABLET | ORAL | Status: AC
  Filled 2011-07-31: qty 4

## 2011-07-31 MED ORDER — SENNOSIDES-DOCUSATE SODIUM 8.6-50 MG PO TABS
2.0000 | ORAL_TABLET | Freq: Every day | ORAL | Status: DC
  Administered 2011-07-31 – 2011-08-01 (×2): 2 via ORAL

## 2011-07-31 MED ORDER — LACTATED RINGERS IV SOLN: INTRAVENOUS | Status: DC

## 2011-07-31 MED ORDER — BENZOCAINE-MENTHOL 20-0.5 % EX AERO: 1.0000 "application " | INHALATION_SPRAY | CUTANEOUS | Status: DC | PRN

## 2011-07-31 MED ORDER — PENICILLIN G POTASSIUM 5000000 UNITS IJ SOLR
5.0000 10*6.[IU] | Freq: Once | INTRAVENOUS | Status: DC
  Filled 2011-07-31: qty 5

## 2011-07-31 MED ORDER — CITRIC ACID-SODIUM CITRATE 334-500 MG/5ML PO SOLN: 30.0000 mL | ORAL | Status: DC | PRN

## 2011-07-31 MED ORDER — IBUPROFEN 600 MG PO TABS
600.0000 mg | ORAL_TABLET | Freq: Four times a day (QID) | ORAL | Status: DC | PRN
  Filled 2011-07-31: qty 1

## 2011-07-31 MED ORDER — DIBUCAINE 1 % RE OINT
1.0000 "application " | TOPICAL_OINTMENT | RECTAL | Status: DC | PRN
  Filled 2011-07-31: qty 28

## 2011-07-31 MED ORDER — LACTATED RINGERS IV SOLN: 500.0000 mL | INTRAVENOUS | Status: DC | PRN

## 2011-07-31 MED ORDER — METHADONE HCL 10 MG PO TABS
140.0000 mg | ORAL_TABLET | ORAL | Status: DC
  Administered 2011-07-31: 140 mg via ORAL
  Filled 2011-07-31: qty 14

## 2011-07-31 MED ORDER — OXYCODONE-ACETAMINOPHEN 5-325 MG PO TABS: 1.0000 | ORAL_TABLET | ORAL | Status: DC | PRN

## 2011-07-31 MED ORDER — FLEET ENEMA 7-19 GM/118ML RE ENEM: 1.0000 | ENEMA | RECTAL | Status: DC | PRN

## 2011-07-31 MED ORDER — LIDOCAINE HCL (PF) 1 % IJ SOLN
INTRAMUSCULAR | Status: DC | PRN
  Administered 2011-07-31 (×2): 5 mL

## 2011-07-31 MED ORDER — WITCH HAZEL-GLYCERIN EX PADS: 1.0000 "application " | MEDICATED_PAD | CUTANEOUS | Status: DC | PRN

## 2011-07-31 MED ORDER — FENTANYL 2.5 MCG/ML BUPIVACAINE 1/10 % EPIDURAL INFUSION (WH - ANES)
14.0000 mL/h | INTRAMUSCULAR | Status: DC
  Administered 2011-07-31: 14 mL/h via EPIDURAL
  Filled 2011-07-31: qty 60

## 2011-07-31 MED ORDER — DIPHENHYDRAMINE HCL 25 MG PO CAPS: 25.0000 mg | ORAL_CAPSULE | Freq: Four times a day (QID) | ORAL | Status: DC | PRN

## 2011-07-31 MED ORDER — ONDANSETRON HCL 4 MG PO TABS: 4.0000 mg | ORAL_TABLET | ORAL | Status: DC | PRN

## 2011-07-31 MED ORDER — FENTANYL CITRATE 0.05 MG/ML IJ SOLN: 100.0000 ug | INTRAMUSCULAR | Status: DC | PRN

## 2011-07-31 MED ORDER — PENICILLIN G POTASSIUM 5000000 UNITS IJ SOLR: 2.5000 10*6.[IU] | Freq: Once | INTRAVENOUS | Status: DC

## 2011-07-31 MED ORDER — NALOXONE HCL 0.4 MG/ML IJ SOLN
INTRAMUSCULAR | Status: AC
  Filled 2011-07-31: qty 1

## 2011-07-31 MED ORDER — DIPHENHYDRAMINE HCL 50 MG/ML IJ SOLN: 12.5000 mg | INTRAMUSCULAR | Status: DC | PRN

## 2011-07-31 MED ORDER — ONDANSETRON HCL 4 MG/2ML IJ SOLN
4.0000 mg | Freq: Four times a day (QID) | INTRAMUSCULAR | Status: DC | PRN
  Administered 2011-07-31: 4 mg via INTRAVENOUS
  Filled 2011-07-31: qty 2

## 2011-07-31 MED ORDER — LANOLIN HYDROUS EX OINT: TOPICAL_OINTMENT | CUTANEOUS | Status: DC | PRN

## 2011-07-31 NOTE — Progress Notes (Signed)
Amnisure and Fern test with Speculum exam.

## 2011-07-31 NOTE — Progress Notes (Signed)
This note also relates to the following rows which could not be included: BP - Cannot attach notes to unvalidated device data Pulse Rate - Cannot attach notes to unvalidated device data Pt is smoking in room, charge nurse Herbert Seta called to assess and talk to pt, pt advised to not smoke in room.  Pt ready to push with cousin now in room.

## 2011-07-31 NOTE — Progress Notes (Signed)
Amnisure not sent per Dr. Adrian BlackwaterJerline Pain test positive

## 2011-07-31 NOTE — Progress Notes (Signed)
This note also relates to the following rows which could not be included: BP - Cannot attach notes to unvalidated device data Pulse Rate - Cannot attach notes to unvalidated device data Report given to [facility] per pt request.  Pt has become anxious with request for different nurse, charge nurse at bedside to assist with change over.  New information concerning pt history of bipolar dx with pt non-compliance on prozac per family member at bedside. Pt also wanted a MD, not a midwife, and request to change hospitals.  Dr Jolayne Panther paged.

## 2011-07-31 NOTE — Progress Notes (Signed)
Pt vomitting- 3 RN's at bedside helping change pt position and clean pt up.

## 2011-07-31 NOTE — Anesthesia Procedure Notes (Addendum)
Epidural Patient location during procedure: OB Start time: 07/31/2011 3:30 AM  Staffing Anesthesiologist: Brayton Caves R Performed by: anesthesiologist   Preanesthetic Checklist Completed: patient identified, site marked, surgical consent, pre-op evaluation, timeout performed, IV checked, risks and benefits discussed and monitors and equipment checked  Epidural Patient position: sitting Prep: site prepped and draped and DuraPrep Patient monitoring: continuous pulse ox and blood pressure Approach: midline Injection technique: LOR air and LOR saline  Needle:  Needle type: Tuohy  Needle gauge: 17 G Needle length: 9 cm Needle insertion depth: 8 cm Catheter type: closed end flexible Catheter size: 19 Gauge Catheter at skin depth: 13 cm Test dose: negative  Assessment Events: blood not aspirated, injection not painful, no injection resistance, negative IV test and no paresthesia  Additional Notes Patient identified.  Risk benefits discussed including failed block, incomplete pain control, headache, nerve damage, paralysis, blood pressure changes, nausea, vomiting, reactions to medication both toxic or allergic, and postpartum back pain. Her lumbar spine films were reviewed and decision to place at L2-L3 was discussed with understanding of increased risk of epidural fibrosis and dural puncture HA s/p surgery. Patient expressed understanding and wished to proceed.  All questions were answered.  Sterile technique used throughout procedure and epidural site dressed with sterile barrier dressing. No paresthesias noted.The patient did not experience any signs of intravascular injection such as tinnitus or metallic taste in mouth.   Although initial aspiration was negative, patient noted rapid numbness on test dosing... Repeat aspiration of catheter showed CSF.... Discussed intrathecal catheter with patient and options including D/C catheter versus continuous low dose infusion.  Patient elected  continuous infusion.  Please see nursing notes for vital signs.

## 2011-07-31 NOTE — Progress Notes (Signed)
Pt does not have urge to push and wants to wait for cousin to arrive before pushing.  Poe, CNM notified of pts decision with approval.

## 2011-07-31 NOTE — Progress Notes (Signed)
This note also relates to the following rows which could not be included: BP - Cannot attach notes to unvalidated device data Pulse Rate - Cannot attach notes to unvalidated device data  07/31/11 0831  Vital Signs  Resp 20   Fetal Heart Rate A  Mode External  Baseline Rate 140 bpm  Variability <5 BPM  Accelerations None  Decelerations Variable  Uterine Activity  Mode Toco  Contraction Frequency (min) 1-5  Contraction Duration (sec) 50  Contraction Quality Mild  Resting Tone Palpated Relaxed  Resting Time Adequate   pt refusing O2 at this time.

## 2011-07-31 NOTE — Progress Notes (Signed)
Pt arrived on the floor with family, states my family is here to see the baby and I am going to take them to NICU. Instructed pt that I need to assess her, bleeding. Pt states "I won't be long.".

## 2011-07-31 NOTE — Progress Notes (Addendum)
Patient ID: Veronica Thomas, female   DOB: 11/10/1975, 36 y.o.   MRN: [id number] Veronica Thomas is a 36 y.o. G4P1021 at [redacted]w[redacted]d, admitted for SROM  Subjective: No urge to push; dozing. Earlier c/o concern with chronic constipation and hemorrhoids.  Objective: BP 95/52  Pulse 76  Temp(Src) 98.4 F (36.9 C) (Oral)  Resp 20  Ht 5\' 7"  (1.702 m)  Wt 104.327 kg (230 lb)  BMI 36.02 kg/m2  SpO2 100%  Fetal Heart Rate: 140 Variability: moderate Accelerations: present Decelerations: absent  Contractions: q3 pitocin at 4 mu  SVE:   Dilation: 10 Effacement (%): 100 Station: +2 Exam by:: POE,CNM Note : this exam was done  1.5 hr ago (note inadvertently written on wrong pt then)  Assessment / Plan: Labor: passive 2nd stage -> trial pushing soon Fetal Wellbeing: Cat 1 FHR, clear AF Pain Control:  Adequate   Hep C -> notify Peds Intrathecal-> anesthesiologist to pull cath postpartum Methadone maintenance-> resume PP GBS-> cont IV PCN    POE,DEIRDRE 07/31/2011, 10:59 AM

## 2011-07-31 NOTE — Progress Notes (Signed)
Pt states, " I have been leaking water since 8:30 pm. I periodically have gushes about thirty minutes."

## 2011-07-31 NOTE — Progress Notes (Signed)
Patient ID: Veronica Thomas, female   DOB: Nov 10, 1975, 36 y.o.   MRN: [id number] Has not begun to push due to waiting for her friend to arrive. OK'd due to reassuring fetal parameters. Friend here and will begin active second stage. Vtx at +2+3.  Nurses entered to find patient smoking in bed a short while ago. Has been in a women's shelter this pregnancy. Will get SW consult postpartum.

## 2011-07-31 NOTE — Anesthesia Preprocedure Evaluation (Addendum)
Anesthesia Evaluation  Patient identified by MRN, date of birth, ID band Patient awake    Reviewed: Allergy & Precautions, H&P , Patient's Chart, lab work & pertinent test results  Airway Mallampati: II TM Distance: >3 FB Neck ROM: full    Dental No notable dental hx.    Pulmonary neg pulmonary ROS, Current Smoker,  clear to auscultation  Pulmonary exam normal       Cardiovascular neg cardio ROS regular Normal    Neuro/Psych PSYCHIATRIC DISORDERS Negative Neurological ROS  Negative Psych ROS   GI/Hepatic negative GI ROS, Neg liver ROS,   Endo/Other  Negative Endocrine ROS  Renal/GU negative Renal ROS     Musculoskeletal   Abdominal   Peds  Hematology negative hematology ROS (+)   Anesthesia Other Findings Hep B Chronic pain on methadone 140mg  qd S/p L3-4-5 back surgery...   Reproductive/Obstetrics (+) Pregnancy                           Anesthesia Physical Anesthesia Plan  ASA: III  Anesthesia Plan: Epidural   Post-op Pain Management:    Induction:   Airway Management Planned:   Additional Equipment:   Intra-op Plan:   Post-operative Plan:   Informed Consent: I have reviewed the patients History and Physical, chart, labs and discussed the procedure including the risks, benefits and alternatives for the proposed anesthesia with the patient or authorized representative who has indicated his/her understanding and acceptance.     Plan Discussed with:   Anesthesia Plan Comments:        Prolonged discussion about back pain post partum/dural puncture headache/ and failed block Anesthesia Quick Evaluation

## 2011-07-31 NOTE — H&P (Signed)
Veronica Thomas is a 36 y.o. female presenting for SROM. Maternal Medical History:  Reason for admission: Reason for admission: rupture of membranes.  Contractions: Onset was 3-5 hours ago.   Frequency: irregular.   Duration is approximately 30 seconds.   Perceived severity is mild.    Fetal activity: Perceived fetal activity is normal.    Prenatal Complications - Diabetes: none.    OB History    Grav Para Term Preterm Abortions TAB SAB Ect Mult Living   4 1 1  0 2 1 1  0 0 1     Past Medical History  Diagnosis Date  . Anxiety   . Depression   . Infection     hepatitis c   Past Surgical History  Procedure Date  . Cholecystectomy   . Back surgery     , hard ware L3,4,5   Family History: family history includes Diabetes in her father and mother. Social History:  reports that she has been smoking Cigarettes.  She has a 21 pack-year smoking history. She has never used smokeless tobacco. She reports that she uses illicit drugs (Marijuana) about once per week. She reports that she does not drink alcohol.  Review of Systems  All other systems reviewed and are negative.    Dilation: 3 Exam by:: Veronica Thomas Blood pressure 130/74, pulse 117, temperature 97.8 F (36.6 C), temperature source Oral, resp. rate 20, height 5\' 7"  (1.702 m), weight 104.327 kg (230 lb). Maternal Exam:  Uterine Assessment: Contraction strength is mild.  Contraction frequency is regular.   Abdomen: Estimated fetal weight is 7#.   Fetal presentation: vertex     Fetal Exam Fetal Monitor Review: Mode: hand-held doppler probe.   Baseline rate: 150.  Variability: moderate (6-25 bpm).   Pattern: accelerations present and no decelerations.    Fetal State Assessment: Category I - tracings are normal.     Physical Exam  Constitutional: She is oriented to person, place, and time. She appears well-developed and well-nourished.  HENT:  Head: Normocephalic and atraumatic.  Cardiovascular: Normal rate.     Respiratory: Effort normal and breath sounds normal.  GI: Soft. Bowel sounds are normal. She exhibits no distension and no mass. There is no tenderness. There is no rebound and no guarding.  Neurological: She is alert and oriented to person, place, and time.  Skin: Skin is warm and dry.  Psychiatric: She has a normal mood and affect. Her behavior is normal. Judgment and thought content normal.    Fern Positive.  Prenatal labs: ABO, Rh: O/Positive/-- (08/28 0000) Antibody: Negative (08/28 0000) Rubella: Immune (08/28 0000) RPR: NON REAC (02/07 1233)  HBsAg: Negative (08/28 0000)  HIV: NON REACTIVE (02/07 1233)  GBS:   positive  Assessment/Plan: 1.  IUP 37 weeks 6 days 2.  Opiate dependence 3.  GBS positive 4.  Late to prenatal care 5.  SROM  Will admit to L&D.  Teaching service for pediatric provider.  Will start pitocin and penicillin.  Epidural.  Will get UDS.     Veronica Thomas Veronica Thomas 07/31/2011, 2:28 AM

## 2011-07-31 NOTE — Progress Notes (Signed)
Pt does not have urge to push,  Continue to labor down per Poe, CNM.

## 2011-07-31 NOTE — Progress Notes (Signed)
This note also relates to the following rows which could not be included: BP - Cannot attach notes to unvalidated device data Pulse Rate - Cannot attach notes to unvalidated device data Pt complete, will monitor fhr q 5 min and document q .  Poe,CNM notified.

## 2011-07-31 NOTE — Progress Notes (Signed)
Nurse called from [facility] unit inquiring about discontinued Methadone>consulted with Dr. Becky Augusta 140 mg Methadone daily

## 2011-07-31 NOTE — Progress Notes (Signed)
Pt tx from L&D via wc to room 304 R. Zang RN at bedside. Pt states "I am going to smoke, and my niece can take me." R. Zang RN reports OB assessment unremarkable. Instructed pt not to stay long, pt agreed.

## 2011-08-01 ENCOUNTER — Encounter (HOSPITAL_COMMUNITY): Payer: Self-pay | Admitting: *Deleted

## 2011-08-01 LAB — CBC
Hemoglobin: 11.3 g/dL — ABNORMAL LOW (ref 12.0–15.0)
MCH: 28.3 pg (ref 26.0–34.0)
MCHC: 33.8 g/dL (ref 30.0–36.0)
RDW: 14.6 % (ref 11.5–15.5)

## 2011-08-01 MED ORDER — NICOTINE 14 MG/24HR TD PT24
14.0000 mg | MEDICATED_PATCH | Freq: Every day | TRANSDERMAL | Status: DC
  Administered 2011-08-01 (×2): 14 mg via TRANSDERMAL
  Filled 2011-08-01 (×3): qty 1

## 2011-08-01 MED ORDER — NICOTINE 14 MG/24HR TD PT24
14.0000 mg | MEDICATED_PATCH | Freq: Every day | TRANSDERMAL | Status: DC
  Filled 2011-08-01: qty 1

## 2011-08-01 NOTE — Progress Notes (Signed)
07/31/11 1515 Social worker paged x2 on different numbers this am by Gasper Lloyd on [employer] and RN Bearl Mulberry today with no response.  Marcie Mowers , Charge Rn able to speak to Child psychotherapist this afternoon and through report obtained at [employer], was able to request SW consult via phone this afternoon for this pt. SW Will see pt this admission. Info passed on to [employer] upon transfer.

## 2011-08-01 NOTE — Anesthesia Postprocedure Evaluation (Signed)
  Anesthesia Post-op Note  Patient: Veronica Thomas  Procedure(s) Performed: * No procedures listed *  Patient Location: PACU and Women's Unit  Anesthesia Type: spinal catheter  Level of Consciousness: awake, alert  and oriented  Airway and Oxygen Therapy: Patient Spontanous Breathing   Post-op Assessment: Patient's Cardiovascular Status Stable, Respiratory Function Stable, No headache, No residual numbness and No residual motor weakness  Post-op Vital Signs: stable  Complications: No apparent anesthesia complications

## 2011-08-01 NOTE — Progress Notes (Signed)
PT in NICU since 1900.
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
## 2011-08-01 NOTE — Progress Notes (Signed)
08/01/11 1000 Pt stated that she was going to order breakfast & then take a walk outside. Reinforced No Smoking Policy & that pt had a Nicotene patch on to curb her cravings. Pt stated that she had removed the patch when she awoke this AM & that the night supervisor "Okey Regal" only told her that she could not smoke in the hospital. Pt became annoyed & wants to talk to the supervisor. She then went to the desk & asked the NT to give her cigarettes back - not given to her. Pt than left the floor.

## 2011-08-01 NOTE — Progress Notes (Signed)
08/01/11 1315 Pt stated that she is going to NICU - pt reminded that the MDA suggested that she lie flat for several hours - pt stated that she would be alright.

## 2011-08-01 NOTE — Progress Notes (Signed)
Post Partum Day 1 Subjective: no complaints, up ad lib, voiding, tolerating PO and + flatus Multiple attempts to round on patient today, unsuccessful. Patient has either been out smoking or in NICU. RN reports patient has been in room for brief periods today and is doing well without complaints. They report she has almost no pain and very light bleeding. Has been pumping. Has been seen by SW (see note). She tells nurse that she and her sister will go tomorrow to look for a place to live.   Objective: Blood pressure 138/86, pulse 80, temperature 97.3 F (36.3 C), temperature source Oral, resp. rate 22, height 5\' 7"  (1.702 m), weight 230 lb (104.327 kg), SpO2 99.00%, unknown if currently breastfeeding.  Physical Exam:  General: alert, cooperative and no distress Lochia: appropriate Uterine Fundus: firm Incision: n/a  DVT Evaluation: No evidence of DVT seen on physical exam.   Basename 08/01/11 0534 07/31/11 0217  HGB 11.3* 13.5  HCT 33.4* 38.9    Assessment/Plan: Plan for discharge tomorrow and Breastfeeding   LOS: 1 day   [facility] 08/01/2011, 7:56 PM

## 2011-08-01 NOTE — Anesthesia Postprocedure Evaluation (Signed)
Patient seen by me for removal of intrathecal catheter.  Catheter was inadvertently placed intrathecal at 0330 on 2/23.  It was maintained and used as intrathecal catheter for labor pain management, and patient reports that it worked very well.  Catheter was left in place after delivery in hopes of decreasing the chance of PDPH.  Now, at 34 hours since placement, I feel maximum benefit of leaving it in has been achieved.  Discussed with patient at length the possibility of PDPH despite having left catheter in.  Also discussed management of PDPH should it arise.  Patient understands.  At this time the patient does not have headache or other complaints.  Catheter removed with tip intact.  Of note, catheter was noted to be 8 cm at skin prior to removal.  Have advised patient to lie down for next several hours and increase PO fluid intake, if possible.  Will continue to follow.  Jasmine December, MD

## 2011-08-01 NOTE — Progress Notes (Signed)
Pt remains in NICU since 1900.

## 2011-08-01 NOTE — Progress Notes (Signed)
Pt was caught smoking cigarettes in her room at 0000.  Security and house coverage notified.  MD notified; previous nurse request nicotine patch for pt.

## 2011-08-02 ENCOUNTER — Encounter: Payer: Self-pay | Admitting: Obstetrics & Gynecology

## 2011-08-02 MED ORDER — IBUPROFEN 600 MG PO TABS: 600.0000 mg | ORAL_TABLET | Freq: Four times a day (QID) | ORAL | Status: DC

## 2011-08-02 NOTE — Progress Notes (Signed)
UR chart review completed.  

## 2011-08-02 NOTE — Progress Notes (Signed)
Post Partum Day 2 Subjective: up ad lib, voiding, tolerating PO, + flatus and outside smoking  Objective: Blood pressure 101/71, pulse 64, temperature 97.4 F (36.3 C), temperature source Oral, resp. rate 16, height 5\' 7"  (1.702 m), weight 230 lb (104.327 kg), SpO2 98.00%, unknown if currently breastfeeding.  Physical Exam:  General: alert Lochia: appropriate Uterine Fundus: firm Incision: na DVT Evaluation: No evidence of DVT seen on physical exam.   Basename 08/01/11 0534 07/31/11 0217  HGB 11.3* 13.5  HCT 33.4* 38.9    Assessment/Plan: Discharge home     LOS: 2 days   Veronica Thomas H 08/02/2011, 7:50 AM

## 2011-08-02 NOTE — Discharge Summary (Signed)
Obstetric Discharge Summary Reason for Admission: onset of labor and rupture of membranes Prenatal Procedures: none Intrapartum Procedures: spontaneous vaginal delivery Postpartum Procedures: none Complications-Operative and Postpartum: methadone Hemoglobin  Date Value Range Status  08/01/2011 11.3* 12.0-15.0 (g/dL) Final     HCT  Date Value Range Status  08/01/2011 33.4* 36.0-46.0 (%) Final    Discharge Diagnoses: Term Pregnancy-delivered and minimal prenatal care  Discharge Information: Date: 08/02/2011 Activity: pelvic rest Diet: routine Medications: Ibuprofen Condition: stable Instructions: refer to practice specific booklet Discharge to: home Follow-up Information    Follow up with [facility] [place] in 6 weeks.   Contact information:   [address] [employer] [place] [address]          Newborn Data: Live born female  Birth Weight: 6 lb 11.4 oz (3045 g) APGAR: 9, 9  Detox in NICU  Veronica Thomas H 08/02/2011, 8:05 AM

## 2011-08-02 NOTE — Anesthesia Postprocedure Evaluation (Signed)
  Anesthesia Post-op Note  Patient: Veronica Thomas  Procedure(s) Performed: * No procedures listed *  Patient Location: Women's Unit  Anesthesia Type: Epidural  Level of Consciousness: awake, alert  and oriented  Airway and Oxygen Therapy: Patient Spontanous Breathing  Post-op Pain: none  Post-op Assessment: Post-op Vital signs reviewed and Patient's Cardiovascular Status Stable  Post-op Vital Signs: Reviewed and stable  Complications: No apparent anesthesia complications

## 2011-08-07 ENCOUNTER — Encounter (HOSPITAL_COMMUNITY): Payer: Self-pay | Admitting: *Deleted

## 2011-08-07 ENCOUNTER — Inpatient Hospital Stay (HOSPITAL_COMMUNITY): Payer: Medicare Other

## 2011-08-07 ENCOUNTER — Inpatient Hospital Stay (HOSPITAL_COMMUNITY)
Admit: 2011-08-07 | Discharge: 2011-08-07 | Disposition: A | Discharge status: Home Health | Payer: Medicare Other | Source: Ambulatory Visit | Attending: Obstetrics and Gynecology | Admitting: Obstetrics and Gynecology

## 2011-08-07 DIAGNOSIS — O99893 Other specified diseases and conditions complicating puerperium: Secondary | ICD-10-CM | POA: Insufficient documentation

## 2011-08-07 DIAGNOSIS — R51 Headache: Secondary | ICD-10-CM | POA: Insufficient documentation

## 2011-08-07 DIAGNOSIS — J189 Pneumonia, unspecified organism: Secondary | ICD-10-CM

## 2011-08-07 HISTORY — DX: Pneumonia, unspecified organism: J18.9

## 2011-08-07 HISTORY — DX: Carpal tunnel syndrome, unspecified upper limb: G56.00

## 2011-08-07 LAB — CBC
HCT: 34.2 % — ABNORMAL LOW (ref 36.0–46.0)
MCHC: 33.6 g/dL (ref 30.0–36.0)
Platelets: 214 10*3/uL (ref 150–400)
RDW: 14.4 % (ref 11.5–15.5)
WBC: 24.8 10*3/uL — ABNORMAL HIGH (ref 4.0–10.5)

## 2011-08-07 LAB — RAPID URINE DRUG SCREEN, HOSP PERFORMED
Benzodiazepines: NOT DETECTED
Opiates: NOT DETECTED

## 2011-08-07 LAB — URINALYSIS, ROUTINE W REFLEX MICROSCOPIC
Glucose, UA: NEGATIVE mg/dL
Ketones, ur: NEGATIVE mg/dL
pH: 5.5 (ref 5.0–8.0)

## 2011-08-07 LAB — URINE MICROSCOPIC-ADD ON

## 2011-08-07 LAB — DIFFERENTIAL
Basophils Absolute: 0 10*3/uL (ref 0.0–0.1)
Basophils Relative: 0 % (ref 0–1)
Monocytes Absolute: 1.9 10*3/uL — ABNORMAL HIGH (ref 0.1–1.0)
Neutro Abs: 21.6 10*3/uL — ABNORMAL HIGH (ref 1.7–7.7)

## 2011-08-07 MED ORDER — SODIUM CHLORIDE 0.9 % IJ SOLN
INTRAMUSCULAR | Status: AC
  Filled 2011-08-07: qty 3

## 2011-08-07 MED ORDER — AZITHROMYCIN 1 G PO PACK: 1.0000 | PACK | Freq: Once | ORAL | Status: AC

## 2011-08-07 MED ORDER — LACTATED RINGERS IV BOLUS (SEPSIS)
1000.0000 mL | Freq: Once | INTRAVENOUS | Status: AC
  Administered 2011-08-07: 1000 mL via INTRAVENOUS

## 2011-08-07 MED ORDER — ACETAMINOPHEN 500 MG PO TABS
1000.0000 mg | ORAL_TABLET | ORAL | Status: AC
  Administered 2011-08-07: 1000 mg via ORAL
  Filled 2011-08-07: qty 2

## 2011-08-07 MED ORDER — CEFTRIAXONE SODIUM 1 G IJ SOLR
1.0000 g | INTRAMUSCULAR | Status: AC
  Administered 2011-08-07: 1 g via INTRAMUSCULAR
  Filled 2011-08-07: qty 10

## 2011-08-07 MED ORDER — AZITHROMYCIN 1 G PO PACK: 1.0000 | PACK | Freq: Once | ORAL | Status: DC

## 2011-08-07 NOTE — Discharge Instructions (Signed)

## 2011-08-07 NOTE — ED Provider Notes (Signed)
History    36 y.o. [name] presents to MAU via EMS postpartum following vaginal delivery on 07/31/11 with report of severe h/a and feeling confused starting at midnight and worsening throughout the morning.  She feels feverish and has chills.  She has had a cough for over 1 month that has worsened in the last week.  She reports ongoing back pain and worries that something went wrong following her epidural removal.  She reports receiving an intrathecal because the anesthesiologist had difficulty with her epidural.  She denies abdominal pain, reports normal lochia without odor, and denies urinary symptoms, or n/v.  She is not light sensitive with her h/a and position changes do not alter the pain. Her h/a has decreased since arrival in the MAU.   Chief Complaint  Patient presents with  . Headache  . Fever  . Back Pain   HPI  OB History    Grav Para Term Preterm Abortions TAB SAB Ect Mult Living   4 2 2  0 2 1 1  0 0 2      Past Medical History  Diagnosis Date  . Anxiety   . Depression   . Infection     hepatitis c  . Schizophrenia   . Substance abuse   . Pneumonia   . Carpal tunnel syndrome     Past Surgical History  Procedure Date  . Cholecystectomy   . Back surgery     , hard ware L3,4,5    Family History  Problem Relation Age of Onset  . Diabetes Mother   . Diabetes Father     History  Substance Use Topics  . Smoking status: Current Everyday Smoker -- 1.0 packs/day for 21 years    Types: Cigarettes  . Smokeless tobacco: Never Used  . Alcohol Use: No    Allergies: No Known Allergies  Prescriptions prior to admission  Medication Sig Dispense Refill  . FLUoxetine (PROZAC) 40 MG capsule Take 40 mg by mouth daily.      . methadone (DOLOPHINE) 10 MG/ML solution Take 140 mg by mouth daily.      . Prenatal Vit-Fe Fumarate-FA (PRENATAL MULTIVITAMIN) TABS Take 1 tablet by mouth daily.        Review of Systems  Constitutional: Positive for fever, chills,  malaise/fatigue and diaphoresis.  HENT: Negative for sore throat.   Eyes: Negative for blurred vision and photophobia.  Respiratory: Positive for cough and sputum production. Negative for shortness of breath and wheezing.   Cardiovascular: Positive for chest pain. Negative for palpitations.  Gastrointestinal: Negative for nausea, vomiting and abdominal pain.  Genitourinary: Negative for dysuria, urgency and frequency.  Musculoskeletal: Positive for back pain.  Skin: Negative for itching and rash.  Neurological: Positive for headaches. Negative for dizziness.  Endo/Heme/Allergies: Negative.   Psychiatric/Behavioral: Negative.    Physical Exam   Blood pressure 118/64, pulse 74, temperature 102.5 F (39.2 C), temperature source Oral, resp. rate 20, SpO2 94.00%.  Physical Exam  Nursing note and vitals reviewed. Constitutional: She is oriented to person, place, and time. She appears distressed.  HENT:  Nose: Nose normal.  Mouth/Throat: Oropharynx is clear and moist.  Eyes: Conjunctivae are normal. Pupils are equal, round, and reactive to light.  Neck: Normal range of motion.  Cardiovascular: Normal rate, regular rhythm, normal heart sounds and intact distal pulses.   Respiratory: Effort normal. She has rales.  GI: Soft. Bowel sounds are normal.  Musculoskeletal: Normal range of motion.  Neurological: She is alert and oriented to person,  place, and time. She has normal reflexes.  Skin: Skin is warm. She is diaphoretic.  Psychiatric: She has a normal mood and affect. Her behavior is normal. Judgment and thought content normal.  Bimanual and pelvic exam deferred  Neg CVA tenderness, neg abdominal tenderness upon palpation in all quandrants, neg suprapubic or adnexal tenderness  Results for orders placed during the hospital encounter of 08/07/11 (from the past 24 hour(s))  URINE RAPID DRUG SCREEN (HOSP PERFORMED)     Status: Abnormal   Collection Time   08/07/11  2:00 PM      Component  Value Range   Opiates NONE DETECTED  NONE DETECTED    Cocaine NONE DETECTED  NONE DETECTED    Benzodiazepines NONE DETECTED  NONE DETECTED    Amphetamines NONE DETECTED  NONE DETECTED    Tetrahydrocannabinol POSITIVE (*) NONE DETECTED    Barbiturates NONE DETECTED  NONE DETECTED   URINALYSIS, ROUTINE W REFLEX MICROSCOPIC     Status: Abnormal   Collection Time   08/07/11  2:00 PM      Component Value Range   Color, Urine YELLOW  YELLOW    APPearance CLEAR  CLEAR    Specific Gravity, Urine 1.010  1.005 - 1.030    pH 5.5  5.0 - 8.0    Glucose, UA NEGATIVE  NEGATIVE (mg/dL)   Hgb urine dipstick LARGE (*) NEGATIVE    Bilirubin Urine NEGATIVE  NEGATIVE    Ketones, ur NEGATIVE  NEGATIVE (mg/dL)   Protein, ur NEGATIVE  NEGATIVE (mg/dL)   Urobilinogen, UA 0.2  0.0 - 1.0 (mg/dL)   Nitrite NEGATIVE  NEGATIVE    Leukocytes, UA SMALL (*) NEGATIVE   URINE MICROSCOPIC-ADD ON     Status: Abnormal   Collection Time   08/07/11  2:00 PM      Component Value Range   Squamous Epithelial / LPF FEW (*) RARE    WBC, UA 7-10  <3 (WBC/hpf)   RBC / HPF 3-6  <3 (RBC/hpf)   Bacteria, UA FEW (*) RARE   CBC     Status: Abnormal   Collection Time   08/07/11  2:55 PM      Component Value Range   WBC 24.8 (*) 4.0 - 10.5 (K/uL)   RBC 4.07  3.87 - 5.11 (MIL/uL)   Hemoglobin 11.5 (*) 12.0 - 15.0 (g/dL)   HCT 11.9 (*) 14.7 - 46.0 (%)   MCV 84.0  78.0 - 100.0 (fL)   MCH 28.3  26.0 - 34.0 (pg)   MCHC 33.6  30.0 - 36.0 (g/dL)   RDW 82.9  56.2 - 13.0 (%)   Platelets 214  150 - 400 (K/uL)  DIFFERENTIAL     Status: Abnormal   Collection Time   08/07/11  2:55 PM      Component Value Range   Neutrophils Relative 87 (*) 43 - 77 (%)   Neutro Abs 21.6 (*) 1.7 - 7.7 (K/uL)   Lymphocytes Relative 5 (*) 12 - 46 (%)   Lymphs Abs 1.2  0.7 - 4.0 (K/uL)   Monocytes Relative 8  3 - 12 (%)   Monocytes Absolute 1.9 (*) 0.1 - 1.0 (K/uL)   Eosinophils Relative 0  0 - 5 (%)   Eosinophils Absolute 0.0  0.0 - 0.7 (K/uL)   Basophils  Relative 0  0 - 1 (%)   Basophils Absolute 0.0  0.0 - 0.1 (K/uL)   Dg Chest 2 View  08/07/2011  *RADIOLOGY REPORT*  Clinical Data: Fever  and cough status post delivery 8 days ago.  CHEST - 2 VIEW  Comparison: None.  Findings: The heart size and mediastinal contours are normal. There is patchy left infrahilar airspace disease which is best seen on the frontal examination but appears to be in the lower lobe on the lateral view.  The right lung is clear.  There is no pleural effusion.  Cholecystectomy clips are noted.  IMPRESSION: Left lower lobe pneumonia.  This should be followed to radiographic clearing.  Original Report Authenticated By: Gerrianne Scale, M.D.    MAU Course  Procedures U/a, CBC with diff, Chest X-ray, IV fluids Rochephin 1 g IM administered, Tylenol 1000 mg PO  MDM Discussed findings and physical assessment with Drs Adrian Blackwater and Constant.  Plan to administer Rocephin in MAU and prescribe zithromax for pt d/c.     Assessment and Plan  A: Left lower lobe pneumonia  P: D/C home  Prescription for Z-pac  Tylenol for fever Return to MAU if SOB, worsening fever/chills, n/v. F/U with [facility] this week (message sent via Epic) and pt will need appointment if not improving.   Thomas, Veronica Grajales 08/07/2011, 4:13 PM

## 2011-08-07 NOTE — Progress Notes (Signed)
This morning woke up with headache and lumbar back pain that started last night, no meds.  States was confused, scared, felt "like I was dying."

## 2011-08-08 NOTE — ED Provider Notes (Signed)
Agree with above note.  Veronica Thomas 08/08/2011 6:50 AM

## 2011-08-10 ENCOUNTER — Telehealth: Payer: Self-pay | Admitting: *Deleted

## 2011-08-10 NOTE — Telephone Encounter (Signed)
Called pt for follow up of her status since MAU visit. Her voice mail box was full but I was able to dial in a call back number to be sent to her.

## 2011-08-10 NOTE — Telephone Encounter (Signed)
Message copied by Jill Side on Tue Aug 10, 2011 10:48 AM ------      Message from: Zola Button L      Created: Mon Aug 09, 2011  9:21 AM      Regarding: FW: F/U needed for PP pt       This was sent to [place] by mistake.  Let us know if she needs appt scheduled after fu phone call.            ----- Message -----         From: Sharen Counter, CNM         Sent: 08/07/2011   6:11 PM           To: [facility] Admin Pool      Subject: F/U needed for PP pt                                     Pt presented to MAU 8 days after vaginal delivery with coughing and fever and was diagnosed with left lower lobe pneumonia.  She was treated with abx in MAU and prescription at discharge but needs a f/u phone call this week.  If she is not improved, she needs an appointment at the clinic.

## 2011-08-11 NOTE — Telephone Encounter (Signed)
Called pt to check on her status- still unable to leave a message due to mailbox being full.

## 2011-08-12 ENCOUNTER — Encounter: Payer: Self-pay | Admitting: Obstetrics & Gynecology

## 2011-08-12 ENCOUNTER — Ambulatory Visit: Payer: Medicare Other | Admitting: Obstetrics & Gynecology

## 2011-08-12 ENCOUNTER — Ambulatory Visit (INDEPENDENT_AMBULATORY_CARE_PROVIDER_SITE_OTHER): Payer: Medicare Other | Admitting: Physician Assistant

## 2011-08-12 DIAGNOSIS — J189 Pneumonia, unspecified organism: Secondary | ICD-10-CM

## 2011-08-12 MED ORDER — AZITHROMYCIN 250 MG PO TABS: ORAL_TABLET | ORAL | Status: AC

## 2011-08-12 NOTE — Progress Notes (Addendum)
History:  36 y.o. [name] here today for left lower lobe pneumonia diagnosed on 08/07/11 in the MAU, was given Rx for azithromycin.  She is s/p SVD on 07/31/11. Still feels like she is not getting better,  Labs and Imaging 08/07/2011  *RADIOLOGY REPORT*  Clinical Data: Fever and cough status post delivery 8 days ago.  CHEST - 2 VIEW  Comparison: None.  Findings: The heart size and mediastinal contours are normal. There is patchy left infrahilar airspace disease which is best seen on the frontal examination but appears to be in the lower lobe on the lateral view.  The right lung is clear.  There is no pleural effusion.  Cholecystectomy clips are noted.  IMPRESSION: Left lower lobe pneumonia.  This should be followed to radiographic clearing.  Original Report Authenticated By: Gerrianne Scale, M.D.    Assessment & Plan:   Patient told to follow up with Urgent Care or PCP. Come back for postpartum check.   Addendum by August Luz.  Pt reports that she is feeling better. Denies wheezing or SOB. Reports breathing much easier and able to take deep breaths. States that Rx for Zithromax was only for 1 gram powder, which she has taken. Desires new Rx to complete z-pak.  WF, NAD, VSS, Afebrile HEENT: grossly NL, no lympadenopathy Lung: slight expiratory wheezing heard in BL lower lobes unresolved after having pt couch. Good air movement through out.  Hrt: RRR, no murmurs or bruits  Assessment: Improving LLL Pneumonia 2 Weeks PP  Plan: Rx Z-pak sent to pharmacy Reviewed improving state and no additional meds needed at this time FU at 6 week pp visit or prn for worsen respiratory s/s  SHORES,SUZANNE E. 5:01 PM

## 2011-08-12 NOTE — Telephone Encounter (Signed)
Patient was seen in office today.  

## 2011-08-12 NOTE — Progress Notes (Signed)
Addended by: August Luz on: 08/12/2011 05:01 PM   Modules accepted: Orders, Level of Service

## 2011-08-12 NOTE — Patient Instructions (Addendum)
Follow up for postpartum visitPneumonia, Adult Pneumonia is an infection of the lungs.  CAUSES Pneumonia may be caused by bacteria or a virus. Usually, these infections are caused by breathing infectious particles into the lungs (respiratory tract). SYMPTOMS   Cough.   Fever.   Chest pain.   Increased rate of breathing.   Wheezing.   Mucus production.  DIAGNOSIS  If you have the common symptoms of pneumonia, your caregiver will typically confirm the diagnosis with a chest X-ray. The X-ray will show an abnormality in the lung (pulmonary infiltrate) if you have pneumonia. Other tests of your blood, urine, or sputum may be done to find the specific cause of your pneumonia. Your caregiver may also do tests (blood gases or pulse oximetry) to see how well your lungs are working. TREATMENT  Some forms of pneumonia may be spread to other people when you cough or sneeze. You may be asked to wear a mask before and during your exam. Pneumonia that is caused by bacteria is treated with antibiotic medicine. Pneumonia that is caused by the influenza virus may be treated with an antiviral medicine. Most other viral infections must run their course. These infections will not respond to antibiotics.  PREVENTION A pneumococcal shot (vaccine) is available to prevent a common bacterial cause of pneumonia. This is usually suggested for:  People over 47 years old.   Patients on chemotherapy.   People with chronic lung problems, such as bronchitis or emphysema.   People with immune system problems.  If you are over 65 or have a high risk condition, you may receive the pneumococcal vaccine if you have not received it before. In some countries, a routine influenza vaccine is also recommended. This vaccine can help prevent some cases of pneumonia.You may be offered the influenza vaccine as part of your care. If you smoke, it is time to quit. You may receive instructions on how to stop smoking. Your caregiver  can provide medicines and counseling to help you quit. HOME CARE INSTRUCTIONS   Cough suppressants may be used if you are losing too much rest. However, coughing protects you by clearing your lungs. You should avoid using cough suppressants if you can.   Your caregiver may have prescribed medicine if he or she thinks your pneumonia is caused by a bacteria or influenza. Finish your medicine even if you start to feel better.   Your caregiver may also prescribe an expectorant. This loosens the mucus to be coughed up.   Only take over-the-counter or prescription medicines for pain, discomfort, or fever as directed by your caregiver.   Do not smoke. Smoking is a common cause of bronchitis and can contribute to pneumonia. If you are a smoker and continue to smoke, your cough may last several weeks after your pneumonia has cleared.   A cold steam vaporizer or humidifier in your room or home may help loosen mucus.   Coughing is often worse at night. Sleeping in a semi-upright position in a recliner or using a couple pillows under your head will help with this.   Get rest as you feel it is needed. Your body will usually let you know when you need to rest.  SEEK IMMEDIATE MEDICAL CARE IF:   Your illness becomes worse. This is especially true if you are elderly or weakened from any other disease.   You cannot control your cough with suppressants and are losing sleep.   You begin coughing up blood.   You develop pain which  is getting worse or is uncontrolled with medicines.   You have a fever.   Any of the symptoms which initially brought you in for treatment are getting worse rather than better.   You develop shortness of breath or chest pain.  MAKE SURE YOU:   Understand these instructions.   Will watch your condition.   Will get help right away if you are not doing well or get worse.  Document Released: 05/24/2005 Document Revised: 05/13/2011 Document Reviewed: 08/13/2010 [facility]  Patient Information 2012 [place], [place].

## 2011-09-02 ENCOUNTER — Ambulatory Visit: Payer: Medicare Other | Admitting: Family Medicine

## 2011-12-16 ENCOUNTER — Ambulatory Visit: Payer: Medicare Other | Admitting: Gastroenterology

## 2012-11-15 ENCOUNTER — Emergency Department (HOSPITAL_COMMUNITY): Payer: No Typology Code available for payment source

## 2012-11-15 ENCOUNTER — Encounter (HOSPITAL_COMMUNITY): Payer: Self-pay | Admitting: Neurological Surgery

## 2012-11-15 ENCOUNTER — Inpatient Hospital Stay (HOSPITAL_COMMUNITY)
Admission: EM | Admit: 2012-11-15 | Discharge: 2012-11-23 | DRG: 472 | Disposition: A | Discharge status: Rehabilitation Facility | Payer: No Typology Code available for payment source | Attending: General Surgery | Admitting: General Surgery

## 2012-11-15 ENCOUNTER — Inpatient Hospital Stay (HOSPITAL_COMMUNITY): Payer: No Typology Code available for payment source

## 2012-11-15 DIAGNOSIS — F3289 Other specified depressive episodes: Secondary | ICD-10-CM | POA: Diagnosis present

## 2012-11-15 DIAGNOSIS — S61409A Unspecified open wound of unspecified hand, initial encounter: Secondary | ICD-10-CM

## 2012-11-15 DIAGNOSIS — G8929 Other chronic pain: Secondary | ICD-10-CM | POA: Diagnosis present

## 2012-11-15 DIAGNOSIS — S42352A Displaced comminuted fracture of shaft of humerus, left arm, initial encounter for closed fracture: Secondary | ICD-10-CM | POA: Diagnosis present

## 2012-11-15 DIAGNOSIS — S2249XA Multiple fractures of ribs, unspecified side, initial encounter for closed fracture: Secondary | ICD-10-CM | POA: Diagnosis present

## 2012-11-15 DIAGNOSIS — S0100XA Unspecified open wound of scalp, initial encounter: Secondary | ICD-10-CM | POA: Diagnosis present

## 2012-11-15 DIAGNOSIS — S0101XA Laceration without foreign body of scalp, initial encounter: Secondary | ICD-10-CM

## 2012-11-15 DIAGNOSIS — S129XXA Fracture of neck, unspecified, initial encounter: Secondary | ICD-10-CM

## 2012-11-15 DIAGNOSIS — Z189 Retained foreign body fragments, unspecified material: Secondary | ICD-10-CM

## 2012-11-15 DIAGNOSIS — S12400B Unspecified displaced fracture of fifth cervical vertebra, initial encounter for open fracture: Secondary | ICD-10-CM

## 2012-11-15 DIAGNOSIS — S27329A Contusion of lung, unspecified, initial encounter: Secondary | ICD-10-CM

## 2012-11-15 DIAGNOSIS — F411 Generalized anxiety disorder: Secondary | ICD-10-CM | POA: Diagnosis present

## 2012-11-15 DIAGNOSIS — F410 Panic disorder [episodic paroxysmal anxiety] without agoraphobia: Secondary | ICD-10-CM | POA: Diagnosis present

## 2012-11-15 DIAGNOSIS — S0181XA Laceration without foreign body of other part of head, initial encounter: Secondary | ICD-10-CM

## 2012-11-15 DIAGNOSIS — S12400A Unspecified displaced fracture of fifth cervical vertebra, initial encounter for closed fracture: Secondary | ICD-10-CM

## 2012-11-15 DIAGNOSIS — Z79899 Other long term (current) drug therapy: Secondary | ICD-10-CM

## 2012-11-15 DIAGNOSIS — R52 Pain, unspecified: Secondary | ICD-10-CM | POA: Diagnosis present

## 2012-11-15 DIAGNOSIS — D62 Acute posthemorrhagic anemia: Secondary | ICD-10-CM | POA: Diagnosis not present

## 2012-11-15 DIAGNOSIS — S060X9A Concussion with loss of consciousness of unspecified duration, initial encounter: Secondary | ICD-10-CM

## 2012-11-15 DIAGNOSIS — Z8619 Personal history of other infectious and parasitic diseases: Secondary | ICD-10-CM

## 2012-11-15 DIAGNOSIS — S42409A Unspecified fracture of lower end of unspecified humerus, initial encounter for closed fracture: Principal | ICD-10-CM | POA: Diagnosis present

## 2012-11-15 DIAGNOSIS — S060XAA Concussion with loss of consciousness status unknown, initial encounter: Secondary | ICD-10-CM

## 2012-11-15 DIAGNOSIS — E119 Type 2 diabetes mellitus without complications: Secondary | ICD-10-CM | POA: Diagnosis present

## 2012-11-15 DIAGNOSIS — S2231XA Fracture of one rib, right side, initial encounter for closed fracture: Secondary | ICD-10-CM

## 2012-11-15 DIAGNOSIS — F329 Major depressive disorder, single episode, unspecified: Secondary | ICD-10-CM | POA: Diagnosis present

## 2012-11-15 DIAGNOSIS — F17200 Nicotine dependence, unspecified, uncomplicated: Secondary | ICD-10-CM | POA: Diagnosis present

## 2012-11-15 HISTORY — DX: Unspecified viral hepatitis C without hepatic coma: B19.20

## 2012-11-15 LAB — POCT I-STAT, CHEM 8
BUN: 15 mg/dL (ref 6–23)
Calcium, Ion: 1.03 mmol/L — ABNORMAL LOW (ref 1.12–1.23)
Chloride: 108 mEq/L (ref 96–112)
Glucose, Bld: 258 mg/dL — ABNORMAL HIGH (ref 70–99)
TCO2: 25 mmol/L (ref 0–100)

## 2012-11-15 LAB — ABO/RH: ABO/RH(D): O POS

## 2012-11-15 MED ORDER — CEFAZOLIN SODIUM-DEXTROSE 2-3 GM-% IV SOLR
INTRAVENOUS | Status: AC
  Administered 2012-11-15: 2000 mg
  Filled 2012-11-15: qty 50

## 2012-11-15 MED ORDER — PANTOPRAZOLE SODIUM 40 MG IV SOLR
40.0000 mg | Freq: Every day | INTRAVENOUS | Status: DC
  Administered 2012-11-15 – 2012-11-16 (×2): 40 mg via INTRAVENOUS
  Filled 2012-11-15 (×3): qty 40

## 2012-11-15 MED ORDER — FENTANYL CITRATE 0.05 MG/ML IJ SOLN
INTRAMUSCULAR | Status: AC
  Filled 2012-11-15: qty 2

## 2012-11-15 MED ORDER — ONDANSETRON HCL 4 MG/2ML IJ SOLN
INTRAMUSCULAR | Status: AC
  Filled 2012-11-15: qty 2

## 2012-11-15 MED ORDER — ONDANSETRON HCL 4 MG/2ML IJ SOLN: 4.0000 mg | Freq: Four times a day (QID) | INTRAMUSCULAR | Status: DC | PRN

## 2012-11-15 MED ORDER — SODIUM CHLORIDE 0.9 % IJ SOLN: 9.0000 mL | INTRAMUSCULAR | Status: DC | PRN

## 2012-11-15 MED ORDER — IOHEXOL 300 MG/ML  SOLN
100.0000 mL | Freq: Once | INTRAMUSCULAR | Status: AC | PRN
  Administered 2012-11-15: 100 mL via INTRAVENOUS

## 2012-11-15 MED ORDER — ONDANSETRON HCL 4 MG/2ML IJ SOLN
4.0000 mg | Freq: Once | INTRAMUSCULAR | Status: AC
  Administered 2012-11-15: 4 mg via INTRAVENOUS

## 2012-11-15 MED ORDER — DIPHENHYDRAMINE HCL 50 MG/ML IJ SOLN: 12.5000 mg | Freq: Four times a day (QID) | INTRAMUSCULAR | Status: DC | PRN

## 2012-11-15 MED ORDER — LORAZEPAM 2 MG/ML IJ SOLN
2.0000 mg | INTRAMUSCULAR | Status: DC | PRN
  Administered 2012-11-16 – 2012-11-21 (×19): 2 mg via INTRAVENOUS
  Filled 2012-11-15 (×19): qty 1

## 2012-11-15 MED ORDER — ONDANSETRON HCL 4 MG/2ML IJ SOLN
INTRAMUSCULAR | Status: AC
  Administered 2012-11-15: 4 mg
  Filled 2012-11-15: qty 2

## 2012-11-15 MED ORDER — MORPHINE SULFATE 2 MG/ML IJ SOLN
INTRAMUSCULAR | Status: AC
  Filled 2012-11-15: qty 3

## 2012-11-15 MED ORDER — FENTANYL CITRATE 0.05 MG/ML IJ SOLN
INTRAMUSCULAR | Status: AC | PRN
  Administered 2012-11-15: 100 ug via INTRAVENOUS

## 2012-11-15 MED ORDER — NALOXONE HCL 0.4 MG/ML IJ SOLN: 0.4000 mg | INTRAMUSCULAR | Status: DC | PRN

## 2012-11-15 MED ORDER — DIPHENHYDRAMINE HCL 12.5 MG/5ML PO ELIX
12.5000 mg | ORAL_SOLUTION | Freq: Four times a day (QID) | ORAL | Status: DC | PRN
Start: 2012-11-15 — End: 2012-11-17
  Filled 2012-11-15: qty 5

## 2012-11-15 MED ORDER — MORPHINE SULFATE 4 MG/ML IJ SOLN
6.0000 mg | Freq: Once | INTRAMUSCULAR | Status: AC
Start: 2012-11-15 — End: 2012-11-15
  Administered 2012-11-15: 6 mg via INTRAVENOUS
  Filled 2012-11-15: qty 2

## 2012-11-15 MED ORDER — CEFAZOLIN SODIUM 1-5 GM-% IV SOLN
1.0000 g | Freq: Four times a day (QID) | INTRAVENOUS | Status: DC
  Administered 2012-11-16 – 2012-11-22 (×25): 1 g via INTRAVENOUS
  Filled 2012-11-15 (×31): qty 50

## 2012-11-15 MED ORDER — PROMETHAZINE HCL 25 MG/ML IJ SOLN
12.5000 mg | Freq: Once | INTRAMUSCULAR | Status: AC
  Administered 2012-11-15: 12.5 mg via INTRAVENOUS
  Filled 2012-11-15: qty 1

## 2012-11-15 MED ORDER — POTASSIUM CHLORIDE IN NACL 20-0.9 MEQ/L-% IV SOLN
INTRAVENOUS | Status: DC
  Administered 2012-11-15: 22:00:00 via INTRAVENOUS
  Filled 2012-11-15 (×5): qty 1000

## 2012-11-15 MED ORDER — HYDROMORPHONE 0.3 MG/ML IV SOLN
INTRAVENOUS | Status: DC
  Administered 2012-11-15: 22:00:00 via INTRAVENOUS
  Administered 2012-11-16: 1.2 mg via INTRAVENOUS
  Administered 2012-11-16: 0.6 mg via INTRAVENOUS
  Administered 2012-11-16: 22:00:00 via INTRAVENOUS
  Administered 2012-11-16: 1.2 mg via INTRAVENOUS
  Administered 2012-11-16: 2.4 mg via INTRAVENOUS
  Administered 2012-11-16: 1.39 mg via INTRAVENOUS
  Administered 2012-11-17: 2.4 mg via INTRAVENOUS
  Administered 2012-11-17: 1.5 mg via INTRAVENOUS
  Filled 2012-11-15 (×4): qty 25

## 2012-11-15 MED ORDER — PANTOPRAZOLE SODIUM 40 MG PO TBEC
40.0000 mg | DELAYED_RELEASE_TABLET | Freq: Every day | ORAL | Status: DC
  Administered 2012-11-17 – 2012-11-22 (×6): 40 mg via ORAL
  Filled 2012-11-15 (×6): qty 1

## 2012-11-15 MED ORDER — MORPHINE SULFATE 2 MG/ML IJ SOLN
INTRAMUSCULAR | Status: AC | PRN
  Administered 2012-11-15: 6 mg via INTRAVENOUS
  Administered 2012-11-15: 4 mg via INTRAVENOUS
  Administered 2012-11-15: 2 mg via INTRAVENOUS
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
## 2012-11-15 NOTE — ED Notes (Signed)
Family at beside. Family given emotional support. 
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
## 2012-11-15 NOTE — ED Notes (Signed)
Pt A/O and speaking in full sentenses. Aspen collar applied with DR Belfi at head of bed to provide stability  during  Collar change.

## 2012-11-15 NOTE — ED Notes (Signed)
Pt A/O and speaking in full sentences. Vitals WNL ,Resp WNL.
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
## 2012-11-15 NOTE — Progress Notes (Signed)
11/15/12 1900  Clinical Encounter Type  Visited With Patient not available;Health care provider  Visit Type Trauma  Recommendations Will refer to day chaplain for f/u support.   Patient was very occupied with receiving care when I responded to trauma page.  No family present yet, but sister en route per ED staff, who plan to page as more support needed/appropriate.  Will also refer to day chaplain for follow-up care.  [address] [place], [place] [phone]

## 2012-11-15 NOTE — ED Notes (Signed)
DR Lindie Spruce and DR Raj Janus at bed sided to assess Lac to head and RT arm.

## 2012-11-15 NOTE — Progress Notes (Signed)
Orthopedic Tech Progress Note Patient Details:  Veronica Thomas 04/07/1976 [id number]  Ortho Devices Type of Ortho Device: Ace wrap;Coapt Ortho Device/Splint Location: LUE Ortho Device/Splint Interventions: Ordered;Application   Jennye Moccasin 11/15/2012, 6:10 PM
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
## 2012-11-16 ENCOUNTER — Encounter (HOSPITAL_COMMUNITY): Payer: Self-pay | Admitting: Anesthesiology

## 2012-11-16 ENCOUNTER — Inpatient Hospital Stay (HOSPITAL_COMMUNITY): Payer: No Typology Code available for payment source | Admitting: Anesthesiology

## 2012-11-16 ENCOUNTER — Inpatient Hospital Stay (HOSPITAL_COMMUNITY): Payer: No Typology Code available for payment source

## 2012-11-16 ENCOUNTER — Encounter (HOSPITAL_COMMUNITY)
Admission: EM | Disposition: A | Discharge status: Rehabilitation Facility | Payer: Self-pay | Source: Home / Self Care | Attending: General Surgery

## 2012-11-16 HISTORY — PX: ANTERIOR CERVICAL CORPECTOMY: SHX1159

## 2012-11-16 LAB — TYPE AND SCREEN
ABO/RH(D): O POS
Antibody Screen: NEGATIVE
Unit division: 0
Unit division: 0

## 2012-11-16 LAB — CBC
Hemoglobin: 12.4 g/dL (ref 12.0–15.0)
MCH: 27.7 pg (ref 26.0–34.0)
MCHC: 34 g/dL (ref 30.0–36.0)
MCV: 81.5 fL (ref 78.0–100.0)
RBC: 4.48 MIL/uL (ref 3.87–5.11)
RDW: 14.3 % (ref 11.5–15.5)

## 2012-11-16 LAB — BASIC METABOLIC PANEL
BUN: 10 mg/dL (ref 6–23)
CO2: 25 mEq/L (ref 19–32)
Chloride: 107 mEq/L (ref 96–112)
Creatinine, Ser: 0.69 mg/dL (ref 0.50–1.10)

## 2012-11-16 SURGERY — ANTERIOR CERVICAL CORPECTOMY
Anesthesia: General | Wound class: Clean

## 2012-11-16 MED ORDER — MORPHINE SULFATE 2 MG/ML IJ SOLN: 1.0000 mg | INTRAMUSCULAR | Status: DC | PRN

## 2012-11-16 MED ORDER — ACETAMINOPHEN 650 MG RE SUPP: 650.0000 mg | RECTAL | Status: DC | PRN

## 2012-11-16 MED ORDER — PHENOL 1.4 % MT LIQD
1.0000 | OROMUCOSAL | Status: DC | PRN
  Administered 2012-11-17: 1 via OROMUCOSAL
  Filled 2012-11-16: qty 177

## 2012-11-16 MED ORDER — 0.9 % SODIUM CHLORIDE (POUR BTL) OPTIME
TOPICAL | Status: DC | PRN
  Administered 2012-11-16: 1000 mL

## 2012-11-16 MED ORDER — ALBUMIN HUMAN 5 % IV SOLN
INTRAVENOUS | Status: DC | PRN
  Administered 2012-11-16: 13:00:00 via INTRAVENOUS

## 2012-11-16 MED ORDER — ONDANSETRON HCL 4 MG/2ML IJ SOLN: 4.0000 mg | INTRAMUSCULAR | Status: DC | PRN

## 2012-11-16 MED ORDER — CHLORHEXIDINE GLUCONATE 0.12 % MT SOLN
15.0000 mL | Freq: Two times a day (BID) | OROMUCOSAL | Status: DC
  Administered 2012-11-16 – 2012-11-20 (×8): 15 mL via OROMUCOSAL
  Filled 2012-11-16 (×5): qty 15

## 2012-11-16 MED ORDER — CEFAZOLIN SODIUM 1-5 GM-% IV SOLN: 1.0000 g | Freq: Three times a day (TID) | INTRAVENOUS | Status: DC

## 2012-11-16 MED ORDER — BACITRACIN 50000 UNITS IM SOLR
INTRAMUSCULAR | Status: AC
  Filled 2012-11-16: qty 1

## 2012-11-16 MED ORDER — METHOCARBAMOL 100 MG/ML IJ SOLN
500.0000 mg | Freq: Four times a day (QID) | INTRAVENOUS | Status: DC | PRN
  Filled 2012-11-16 (×2): qty 5

## 2012-11-16 MED ORDER — LIDOCAINE HCL 4 % MT SOLN
OROMUCOSAL | Status: DC | PRN
  Administered 2012-11-16: 4 mL via TOPICAL

## 2012-11-16 MED ORDER — SODIUM CHLORIDE 0.9 % IJ SOLN
3.0000 mL | Freq: Two times a day (BID) | INTRAMUSCULAR | Status: DC
  Administered 2012-11-17 – 2012-11-22 (×6): 3 mL via INTRAVENOUS

## 2012-11-16 MED ORDER — ALBUTEROL SULFATE (5 MG/ML) 0.5% IN NEBU
INHALATION_SOLUTION | RESPIRATORY_TRACT | Status: AC
  Administered 2012-11-16: 2.5 mg via RESPIRATORY_TRACT
  Filled 2012-11-16: qty 0.5

## 2012-11-16 MED ORDER — PHENYLEPHRINE HCL 10 MG/ML IJ SOLN
INTRAMUSCULAR | Status: DC | PRN
  Administered 2012-11-16 (×2): 80 ug via INTRAVENOUS

## 2012-11-16 MED ORDER — GLYCOPYRROLATE 0.2 MG/ML IJ SOLN
INTRAMUSCULAR | Status: DC | PRN
  Administered 2012-11-16: 0.6 mg via INTRAVENOUS

## 2012-11-16 MED ORDER — SODIUM CHLORIDE 0.9 % IV SOLN
INTRAVENOUS | Status: AC
  Filled 2012-11-16: qty 500

## 2012-11-16 MED ORDER — VECURONIUM BROMIDE 10 MG IV SOLR
INTRAVENOUS | Status: DC | PRN
  Administered 2012-11-16: 1 mg via INTRAVENOUS
  Administered 2012-11-16: 4 mg via INTRAVENOUS
  Administered 2012-11-16: 1 mg via INTRAVENOUS

## 2012-11-16 MED ORDER — HYDROMORPHONE HCL PF 1 MG/ML IJ SOLN
INTRAMUSCULAR | Status: DC | PRN
  Administered 2012-11-16 (×2): 0.5 mg via INTRAVENOUS

## 2012-11-16 MED ORDER — CEFAZOLIN SODIUM-DEXTROSE 2-3 GM-% IV SOLR
INTRAVENOUS | Status: AC
  Administered 2012-11-16: 2 g via INTRAVENOUS
  Filled 2012-11-16: qty 50

## 2012-11-16 MED ORDER — ONDANSETRON HCL 4 MG/2ML IJ SOLN
INTRAMUSCULAR | Status: DC | PRN
  Administered 2012-11-16: 4 mg via INTRAVENOUS

## 2012-11-16 MED ORDER — OXYCODONE-ACETAMINOPHEN 5-325 MG PO TABS
1.0000 | ORAL_TABLET | ORAL | Status: DC | PRN
Start: 2012-11-16 — End: 2012-11-17

## 2012-11-16 MED ORDER — SODIUM CHLORIDE 0.9 % IJ SOLN
3.0000 mL | INTRAMUSCULAR | Status: DC | PRN
  Administered 2012-11-23 (×2): 3 mL via INTRAVENOUS

## 2012-11-16 MED ORDER — METHOCARBAMOL 500 MG PO TABS
500.0000 mg | ORAL_TABLET | Freq: Four times a day (QID) | ORAL | Status: DC | PRN
  Administered 2012-11-17 – 2012-11-22 (×10): 500 mg via ORAL
  Filled 2012-11-16 (×11): qty 1

## 2012-11-16 MED ORDER — ARTIFICIAL TEARS OP OINT
TOPICAL_OINTMENT | OPHTHALMIC | Status: DC | PRN
  Administered 2012-11-16: 1 via OPHTHALMIC

## 2012-11-16 MED ORDER — DEXAMETHASONE 4 MG PO TABS
4.0000 mg | ORAL_TABLET | Freq: Four times a day (QID) | ORAL | Status: DC
  Administered 2012-11-16 – 2012-11-22 (×14): 4 mg via ORAL
  Filled 2012-11-16 (×28): qty 1

## 2012-11-16 MED ORDER — POTASSIUM CHLORIDE IN NACL 20-0.9 MEQ/L-% IV SOLN
INTRAVENOUS | Status: DC
  Administered 2012-11-16 – 2012-11-19 (×2): via INTRAVENOUS
  Filled 2012-11-16 (×6): qty 1000

## 2012-11-16 MED ORDER — NEOSTIGMINE METHYLSULFATE 1 MG/ML IJ SOLN
INTRAMUSCULAR | Status: DC | PRN
  Administered 2012-11-16: 4 mg via INTRAVENOUS

## 2012-11-16 MED ORDER — ACETAMINOPHEN 325 MG PO TABS: 650.0000 mg | ORAL_TABLET | ORAL | Status: DC | PRN

## 2012-11-16 MED ORDER — ROCURONIUM BROMIDE 100 MG/10ML IV SOLN
INTRAVENOUS | Status: DC | PRN
  Administered 2012-11-16: 50 mg via INTRAVENOUS

## 2012-11-16 MED ORDER — BACITRACIN 50000 UNITS IM SOLR
INTRAMUSCULAR | Status: DC | PRN
  Administered 2012-11-16: 14:00:00

## 2012-11-16 MED ORDER — ALBUTEROL SULFATE HFA 108 (90 BASE) MCG/ACT IN AERS
INHALATION_SPRAY | RESPIRATORY_TRACT | Status: DC | PRN
  Administered 2012-11-16 (×2): 2 via RESPIRATORY_TRACT

## 2012-11-16 MED ORDER — PROPOFOL 10 MG/ML IV BOLUS
INTRAVENOUS | Status: DC | PRN
  Administered 2012-11-16: 100 mg via INTRAVENOUS
  Administered 2012-11-16: 150 mg via INTRAVENOUS

## 2012-11-16 MED ORDER — IPRATROPIUM BROMIDE 0.02 % IN SOLN
0.5000 mg | Freq: Four times a day (QID) | RESPIRATORY_TRACT | Status: DC
  Administered 2012-11-16 – 2012-11-17 (×5): 0.5 mg via RESPIRATORY_TRACT
  Filled 2012-11-16 (×5): qty 2.5

## 2012-11-16 MED ORDER — DEXMEDETOMIDINE HCL IN NACL 200 MCG/50ML IV SOLN
INTRAVENOUS | Status: DC | PRN
  Administered 2012-11-16: 0.5 ug/kg/h via INTRAVENOUS

## 2012-11-16 MED ORDER — LIDOCAINE HCL (CARDIAC) 20 MG/ML IV SOLN
INTRAVENOUS | Status: DC | PRN
  Administered 2012-11-16: 100 mg via INTRAVENOUS

## 2012-11-16 MED ORDER — DEXMEDETOMIDINE HCL IN NACL 200 MCG/50ML IV SOLN
0.4000 ug/kg/h | Freq: Once | INTRAVENOUS | Status: DC
  Filled 2012-11-16: qty 50

## 2012-11-16 MED ORDER — DEXAMETHASONE SODIUM PHOSPHATE 4 MG/ML IJ SOLN
4.0000 mg | Freq: Four times a day (QID) | INTRAMUSCULAR | Status: DC
  Administered 2012-11-16 – 2012-11-21 (×8): 4 mg via INTRAVENOUS
  Filled 2012-11-16 (×26): qty 1

## 2012-11-16 MED ORDER — DEXAMETHASONE SODIUM PHOSPHATE 10 MG/ML IJ SOLN
10.0000 mg | Freq: Once | INTRAMUSCULAR | Status: AC
  Administered 2012-11-16: 10 mg via INTRAVENOUS

## 2012-11-16 MED ORDER — BIOTENE DRY MOUTH MT LIQD
15.0000 mL | Freq: Two times a day (BID) | OROMUCOSAL | Status: DC
  Administered 2012-11-16 – 2012-11-20 (×8): 15 mL via OROMUCOSAL

## 2012-11-16 MED ORDER — IPRATROPIUM BROMIDE 0.02 % IN SOLN
RESPIRATORY_TRACT | Status: AC
  Administered 2012-11-16: 0.5 mg via RESPIRATORY_TRACT
  Filled 2012-11-16: qty 2.5

## 2012-11-16 MED ORDER — ALBUTEROL SULFATE (5 MG/ML) 0.5% IN NEBU
2.5000 mg | INHALATION_SOLUTION | Freq: Four times a day (QID) | RESPIRATORY_TRACT | Status: DC
  Administered 2012-11-16 – 2012-11-17 (×5): 2.5 mg via RESPIRATORY_TRACT
  Filled 2012-11-16 (×5): qty 0.5

## 2012-11-16 MED ORDER — MENTHOL 3 MG MT LOZG: 1.0000 | LOZENGE | OROMUCOSAL | Status: DC | PRN

## 2012-11-16 MED ORDER — LACTATED RINGERS IV SOLN
INTRAVENOUS | Status: DC | PRN
  Administered 2012-11-16 (×2): via INTRAVENOUS

## 2012-11-16 MED ORDER — GELATIN ABSORBABLE MT POWD
OROMUCOSAL | Status: DC | PRN
  Administered 2012-11-16 (×2): via TOPICAL

## 2012-11-16 MED ORDER — BUPIVACAINE HCL (PF) 0.25 % IJ SOLN
INTRAMUSCULAR | Status: DC | PRN
  Administered 2012-11-16: 3 mL

## 2012-11-16 MED ORDER — SODIUM CHLORIDE 0.9 % IV SOLN: 250.0000 mL | INTRAVENOUS | Status: DC

## 2012-11-16 MED ORDER — THROMBIN 20000 UNITS EX SOLR
CUTANEOUS | Status: DC | PRN
  Administered 2012-11-16: 14:00:00 via TOPICAL

## 2012-11-16 MED ORDER — FENTANYL CITRATE 0.05 MG/ML IJ SOLN
INTRAMUSCULAR | Status: DC | PRN
  Administered 2012-11-16: 100 ug via INTRAVENOUS
  Administered 2012-11-16: 150 ug via INTRAVENOUS
  Administered 2012-11-16 (×3): 50 ug via INTRAVENOUS
  Administered 2012-11-16: 100 ug via INTRAVENOUS

## 2012-11-16 SURGICAL SUPPLY — 56 items
APL SKNCLS STERI-STRIP NONHPOA (GAUZE/BANDAGES/DRESSINGS) ×1
BAG DECANTER FOR FLEXI CONT (MISCELLANEOUS) ×2 IMPLANT
BENZOIN TINCTURE PRP APPL 2/3 (GAUZE/BANDAGES/DRESSINGS) ×2 IMPLANT
BIT DRILL SPINE QC 14 (BIT) ×1 IMPLANT
BUR MATCHSTICK NEURO 3.0 LAGG (BURR) ×2 IMPLANT
CAGE CORPECTOMY NIKO 25M SPINE (Cage) ×1 IMPLANT
CANISTER SUCTION 2500CC (MISCELLANEOUS) ×2 IMPLANT
CLOTH BEACON ORANGE TIMEOUT ST (SAFETY) ×2 IMPLANT
CONT SPEC 4OZ CLIKSEAL STRL BL (MISCELLANEOUS) ×2 IMPLANT
DRAPE C-ARM 42X72 X-RAY (DRAPES) ×4 IMPLANT
DRAPE LAPAROTOMY 100X72 PEDS (DRAPES) ×2 IMPLANT
DRAPE MICROSCOPE LEICA (MISCELLANEOUS) ×1 IMPLANT
DRAPE MICROSCOPE ZEISS OPMI (DRAPES) ×1 IMPLANT
DRAPE POUCH INSTRU U-SHP 10X18 (DRAPES) ×2 IMPLANT
DRESSING TELFA 8X3 (GAUZE/BANDAGES/DRESSINGS) ×2 IMPLANT
DRSG OPSITE 4X5.5 SM (GAUZE/BANDAGES/DRESSINGS) ×2 IMPLANT
DURAPREP 6ML APPLICATOR 50/CS (WOUND CARE) ×2 IMPLANT
ELECT COATED BLADE 2.86 ST (ELECTRODE) ×2 IMPLANT
ELECT REM PT RETURN 9FT ADLT (ELECTROSURGICAL) ×2
ELECTRODE REM PT RTRN 9FT ADLT (ELECTROSURGICAL) ×1 IMPLANT
GAUZE SPONGE 4X4 16PLY XRAY LF (GAUZE/BANDAGES/DRESSINGS) IMPLANT
GLOVE BIO SURGEON STRL SZ8 (GLOVE) ×3 IMPLANT
GLOVE BIO SURGEON STRL SZ8.5 (GLOVE) ×1 IMPLANT
GLOVE BIOGEL PI IND STRL 7.0 (GLOVE) IMPLANT
GLOVE BIOGEL PI IND STRL 8.5 (GLOVE) IMPLANT
GLOVE BIOGEL PI INDICATOR 7.0 (GLOVE) ×2
GLOVE BIOGEL PI INDICATOR 8.5 (GLOVE) ×1
GLOVE SURG SS PI 7.0 STRL IVOR (GLOVE) ×3 IMPLANT
GLOVE SURG SS PI 8.0 STRL IVOR (GLOVE) ×1 IMPLANT
GOWN BRE IMP SLV AUR LG STRL (GOWN DISPOSABLE) IMPLANT
GOWN BRE IMP SLV AUR XL STRL (GOWN DISPOSABLE) ×6 IMPLANT
GOWN STRL REIN 2XL LVL4 (GOWN DISPOSABLE) IMPLANT
HEMOSTAT POWDER KIT SURGIFOAM (HEMOSTASIS) ×2 IMPLANT
KIT BASIN OR (CUSTOM PROCEDURE TRAY) ×2 IMPLANT
KIT ROOM TURNOVER OR (KITS) ×2 IMPLANT
NDL HYPO 25X1 1.5 SAFETY (NEEDLE) ×1 IMPLANT
NDL SPNL 20GX3.5 QUINCKE YW (NEEDLE) ×1 IMPLANT
NEEDLE HYPO 25X1 1.5 SAFETY (NEEDLE) ×2 IMPLANT
NEEDLE SPNL 20GX3.5 QUINCKE YW (NEEDLE) ×2 IMPLANT
NS IRRIG 1000ML POUR BTL (IV SOLUTION) ×2 IMPLANT
PACK LAMINECTOMY NEURO (CUSTOM PROCEDURE TRAY) ×2 IMPLANT
PAD ARMBOARD 7.5X6 YLW CONV (MISCELLANEOUS) ×8 IMPLANT
PLATE ANT CERV XTEND 2 LV 30 (Plate) ×1 IMPLANT
RUBBERBAND STERILE (MISCELLANEOUS) ×4 IMPLANT
SCREW FXD 4.2 XTD SELF TAP (Screw) ×4 IMPLANT
SPONGE GAUZE 4X4 12PLY (GAUZE/BANDAGES/DRESSINGS) ×1 IMPLANT
SPONGE INTESTINAL PEANUT (DISPOSABLE) ×2 IMPLANT
SPONGE SURGIFOAM ABS GEL 100 (HEMOSTASIS) ×2 IMPLANT
STRIP CLOSURE SKIN 1/2X4 (GAUZE/BANDAGES/DRESSINGS) ×2 IMPLANT
SUT VIC AB 3-0 SH 8-18 (SUTURE) ×4 IMPLANT
SYR 20CC LL (SYRINGE) ×1 IMPLANT
SYR 20ML ECCENTRIC (SYRINGE) ×2 IMPLANT
TOWEL OR 17X24 6PK STRL BLUE (TOWEL DISPOSABLE) ×2 IMPLANT
TOWEL OR 17X26 10 PK STRL BLUE (TOWEL DISPOSABLE) ×2 IMPLANT
TRAP SPECIMEN MUCOUS 40CC (MISCELLANEOUS) IMPLANT
WATER STERILE IRR 1000ML POUR (IV SOLUTION) ×2 IMPLANT

## 2012-11-16 NOTE — Progress Notes (Signed)
Chart reviewed Will continue to follow patient for dr. Ranell Patrick Will check xrays of humerus this weekend and decide about plate fixation of humerus next week if displacement occurs. O/w continue with coaptation splint

## 2012-11-16 NOTE — Progress Notes (Signed)
Pt transferred to Neuro OR for surgery by Dr. Yetta Barre. Consent form signed and placed in chart. Veronica Thomas

## 2012-11-16 NOTE — Anesthesia Procedure Notes (Signed)
Procedure Name: Intubation Date/Time: 11/16/2012 1:05 PM Performed by: Luster Landsberg Pre-anesthesia Checklist: Patient identified, Emergency Drugs available, Suction available and Patient being monitored Patient Re-evaluated:Patient Re-evaluated prior to inductionOxygen Delivery Method: Circle system utilized Preoxygenation: Pre-oxygenation with 100% oxygen Intubation Type: IV induction Ventilation: Mask ventilation without difficulty and Oral airway inserted - appropriate to patient size Laryngoscope Size: Mac and 3 Grade View: Grade II Tube type: Oral Tube size: 7.5 mm Number of attempts: 1 Airway Equipment and Method: Bougie stylet and LTA kit utilized Placement Confirmation: ETT inserted through vocal cords under direct vision,  positive ETCO2 and breath sounds checked- equal and bilateral Secured at: 23 cm Tube secured with: Tape Dental Injury: Teeth and Oropharynx as per pre-operative assessment

## 2012-11-16 NOTE — Preoperative (Signed)
Beta Blockers   Reason not to administer Beta Blockers:Not Applicable 
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
## 2012-11-16 NOTE — Progress Notes (Signed)
UR completed 

## 2012-11-16 NOTE — Progress Notes (Signed)
Orthopedics Progress Note  Discussed the patient's case with Dr Bradly Bienenstock, who will assume her orthopedic care on 6/12. Continue coaptation splint for humerus fracture and pain management.  Almedia Balls. Ranell Patrick, MD 11/16/2012 1:14 AM

## 2012-11-16 NOTE — Progress Notes (Signed)
6/12  Lab glucose on 6/11 was 258 mg/dl.  Suggest checking HgbA1C for past blood glucose control.  Smith Mince RN BSN CDE

## 2012-11-16 NOTE — Anesthesia Postprocedure Evaluation (Signed)
  Anesthesia Post-op Note  Patient: Veronica Thomas  Procedure(s) Performed: Procedure(s) with comments: ANTERIOR CERVICAL CORPECTOMY C-5 (N/A) - Cervcial Five  Patient Location: PACU  Anesthesia Type:General  Level of Consciousness: awake, oriented and patient cooperative  Airway and Oxygen Therapy: Patient Spontanous Breathing  Post-op Pain: moderate  Post-op Assessment: Post-op Vital signs reviewed, Patient's Cardiovascular Status Stable, Respiratory Function Stable, Patent Airway, No signs of Nausea or vomiting and Pain level controlled  Post-op Vital Signs: Reviewed and stable  Complications: No apparent anesthesia complications
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
## 2012-11-16 NOTE — Transfer of Care (Signed)
Immediate Anesthesia Transfer of Care Note  Patient: Veronica Thomas  Procedure(s) Performed: Procedure(s) with comments: ANTERIOR CERVICAL CORPECTOMY C-5 (N/A) - Cervcial Five  Patient Location: PACU  Anesthesia Type:General  Level of Consciousness: responds to stimulation  Airway & Oxygen Therapy: Patient Spontanous Breathing and Patient connected to face mask oxygen  Post-op Assessment: Report given to PACU RN  Post vital signs: Reviewed and stable  Complications: No apparent anesthesia complications

## 2012-11-16 NOTE — Progress Notes (Signed)
Patient ID: Veronica Thomas, female   DOB: Oct 08, 1975, 37 y.o.   MRN: [id number] I have spoken with Dr. Janee Morn. We will try to get the surgery done today if possible. I have examined her and she still appears to be neurologically intact. She hurts everywhere, denies numbness tingling or weakness in the arms or legs. She is in a cervical collar. I have recommended a C5 corpectomy with strut graft and plating. I've tried to describe the surgery to her as best I can answer questions as best I can. The risk of the surgery include but aren't limited to bleeding, infection, spinal cord injury, numbness, weakness, paralysis, CSF leak, nerve root injury, pseudoarthrosis, hardware failure, recurrent laryngeal nerve injury, hoarseness, swallowing difficulties, esophageal injury, tracheal injury, carotid artery injury, vertebral artery injury, stroke, and anesthesia risk including pneumonia DVT and death. She agrees to proceed.
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
## 2012-11-17 ENCOUNTER — Inpatient Hospital Stay (HOSPITAL_COMMUNITY): Payer: No Typology Code available for payment source

## 2012-11-17 DIAGNOSIS — E119 Type 2 diabetes mellitus without complications: Secondary | ICD-10-CM

## 2012-11-17 LAB — BASIC METABOLIC PANEL
CO2: 27 mEq/L (ref 19–32)
Chloride: 99 mEq/L (ref 96–112)
Creatinine, Ser: 0.54 mg/dL (ref 0.50–1.10)
GFR calc Af Amer: >90 mL/min (ref >90)
Potassium: 4.1 mEq/L (ref 3.5–5.1)

## 2012-11-17 LAB — CBC
MCV: 80.8 fL (ref 78.0–100.0)
Platelets: 162 10*3/uL (ref 150–400)
RBC: 4.07 MIL/uL (ref 3.87–5.11)
RDW: 14.4 % (ref 11.5–15.5)
WBC: 15.1 10*3/uL — ABNORMAL HIGH (ref 4.0–10.5)

## 2012-11-17 MED ORDER — TRAMADOL HCL 50 MG PO TABS
50.0000 mg | ORAL_TABLET | Freq: Four times a day (QID) | ORAL | Status: DC
  Administered 2012-11-17 – 2012-11-18 (×4): 50 mg via ORAL
  Filled 2012-11-17 (×10): qty 1

## 2012-11-17 MED ORDER — HYDROMORPHONE HCL PF 1 MG/ML IJ SOLN
1.0000 mg | INTRAMUSCULAR | Status: DC | PRN
  Administered 2012-11-17 – 2012-11-20 (×19): 1 mg via INTRAVENOUS
  Filled 2012-11-17 (×19): qty 1

## 2012-11-17 MED ORDER — IPRATROPIUM BROMIDE 0.02 % IN SOLN
0.5000 mg | Freq: Three times a day (TID) | RESPIRATORY_TRACT | Status: DC
  Administered 2012-11-18 – 2012-11-23 (×15): 0.5 mg via RESPIRATORY_TRACT
  Filled 2012-11-17 (×18): qty 2.5

## 2012-11-17 MED ORDER — OXYCODONE HCL 5 MG PO TABS
5.0000 mg | ORAL_TABLET | ORAL | Status: DC | PRN
  Administered 2012-11-17 – 2012-11-20 (×10): 15 mg via ORAL
  Filled 2012-11-17 (×10): qty 3

## 2012-11-17 MED ORDER — ALBUTEROL SULFATE (5 MG/ML) 0.5% IN NEBU
2.5000 mg | INHALATION_SOLUTION | Freq: Three times a day (TID) | RESPIRATORY_TRACT | Status: DC
  Administered 2012-11-18 – 2012-11-23 (×15): 2.5 mg via RESPIRATORY_TRACT
  Filled 2012-11-17 (×18): qty 0.5

## 2012-11-17 NOTE — Progress Notes (Signed)
6/13  Lab glucose today is 266 mg/dl. Recommend starting Novolog SENSITIVE correction scale AC & HS and check CBGs AC & HS while on Decadron.  Will continue to follow while in hospital.  Could check HgbA1C for past blood glucose control.  Smith Mince RN BSN CDE

## 2012-11-17 NOTE — Progress Notes (Signed)
1200 PCA docflow sheet: 2.1 mg recieved
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
## 2012-11-17 NOTE — Progress Notes (Signed)
1720 Pt transferred via bed with oxygen to 6 north Rm 10

## 2012-11-17 NOTE — Progress Notes (Signed)
Patient ID: Veronica Thomas, female   DOB: 1976/01/07, 37 y.o.   MRN: [id number] Seems to be doing okay on postop day 1. Her neurologic exam is unchanged. Her grips are equal. She wiggles her toes. Dressing is dry and she remains in her collar. Continue current management
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
## 2012-11-17 NOTE — Progress Notes (Signed)
1200 Pt refused to have foley catheter removed

## 2012-11-17 NOTE — Progress Notes (Signed)
PT Cancellation Note  Patient Details Name: Veronica Thomas MRN: [id number] DOB: 04/16/1976   Cancelled Treatment:    Reason Eval/Treat Not Completed: Pain limiting ability to participate;Fatigue/lethargy limiting ability to participate.  Attempted to see patient x2 - politely declined.  Patient reports she is having a "really bad day".  Will return in am for PT evaluation.   Vena Austria 11/17/2012, 2:46 PM Durenda Hurt. Renaldo Fiddler, [facility] Pager [phone]
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
## 2012-11-18 DIAGNOSIS — G8929 Other chronic pain: Secondary | ICD-10-CM | POA: Diagnosis present

## 2012-11-18 DIAGNOSIS — S12400A Unspecified displaced fracture of fifth cervical vertebra, initial encounter for closed fracture: Secondary | ICD-10-CM

## 2012-11-18 DIAGNOSIS — E119 Type 2 diabetes mellitus without complications: Secondary | ICD-10-CM | POA: Diagnosis present

## 2012-11-18 DIAGNOSIS — S42352A Displaced comminuted fracture of shaft of humerus, left arm, initial encounter for closed fracture: Secondary | ICD-10-CM | POA: Diagnosis present

## 2012-11-18 DIAGNOSIS — S2231XA Fracture of one rib, right side, initial encounter for closed fracture: Secondary | ICD-10-CM

## 2012-11-18 DIAGNOSIS — S42309A Unspecified fracture of shaft of humerus, unspecified arm, initial encounter for closed fracture: Secondary | ICD-10-CM

## 2012-11-18 LAB — GLUCOSE, CAPILLARY: Glucose-Capillary: 336 mg/dL — ABNORMAL HIGH (ref 70–99)

## 2012-11-18 MED ORDER — INSULIN ASPART 100 UNIT/ML ~~LOC~~ SOLN: 2.0000 [IU] | SUBCUTANEOUS | Status: DC

## 2012-11-18 MED ORDER — INSULIN ASPART 100 UNIT/ML ~~LOC~~ SOLN
0.0000 [IU] | Freq: Three times a day (TID) | SUBCUTANEOUS | Status: DC
  Administered 2012-11-18: 15 [IU] via SUBCUTANEOUS
  Administered 2012-11-18: 7 [IU] via SUBCUTANEOUS
  Administered 2012-11-19: 15 [IU] via SUBCUTANEOUS
  Administered 2012-11-19: 20 [IU] via SUBCUTANEOUS
  Administered 2012-11-19 – 2012-11-20 (×3): 11 [IU] via SUBCUTANEOUS
  Administered 2012-11-20: 20 [IU] via SUBCUTANEOUS
  Administered 2012-11-21: 7 [IU] via SUBCUTANEOUS
  Administered 2012-11-21: 20 [IU] via SUBCUTANEOUS
  Administered 2012-11-21: 11 [IU] via SUBCUTANEOUS
  Administered 2012-11-22: 7 [IU] via SUBCUTANEOUS
  Administered 2012-11-22: 4 [IU] via SUBCUTANEOUS
  Administered 2012-11-22: 7 [IU] via SUBCUTANEOUS
  Administered 2012-11-23: 3 [IU] via SUBCUTANEOUS

## 2012-11-18 MED ORDER — INSULIN ASPART 100 UNIT/ML ~~LOC~~ SOLN
0.0000 [IU] | Freq: Every day | SUBCUTANEOUS | Status: DC
  Administered 2012-11-18 – 2012-11-20 (×3): 4 [IU] via SUBCUTANEOUS
  Administered 2012-11-21: 3 [IU] via SUBCUTANEOUS

## 2012-11-18 NOTE — Progress Notes (Signed)
Patient ID: Veronica Thomas, female   DOB: 1975/06/14, 37 y.o.   MRN: [id number] Neuro stable, dressing in place. Swallows well.
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
## 2012-11-18 NOTE — Progress Notes (Signed)
WILL ORDER HUMERUS FILMS IN AM WILL REVIEW AND DECIDE ABOUT CONTINUATION OF NONOP VERUS OPERATIVE INTERVENTION WILL CONTINUE TO FOLLOW
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
## 2012-11-18 NOTE — Progress Notes (Signed)
Will get medicine to see given multiple medical problems.pain an issue.
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
## 2012-11-19 ENCOUNTER — Inpatient Hospital Stay (HOSPITAL_COMMUNITY): Payer: No Typology Code available for payment source

## 2012-11-19 LAB — GLUCOSE, CAPILLARY
Glucose-Capillary: 291 mg/dL — ABNORMAL HIGH (ref 70–99)
Glucose-Capillary: 305 mg/dL — ABNORMAL HIGH (ref 70–99)

## 2012-11-19 MED ORDER — DEXAMETHASONE SODIUM PHOSPHATE 10 MG/ML IJ SOLN
INTRAMUSCULAR | Status: AC
  Filled 2012-11-19: qty 1

## 2012-11-19 MED ORDER — INSULIN GLARGINE 100 UNIT/ML ~~LOC~~ SOLN
10.0000 [IU] | Freq: Every day | SUBCUTANEOUS | Status: DC
  Administered 2012-11-19: 10 [IU] via SUBCUTANEOUS
  Filled 2012-11-19 (×2): qty 0.1

## 2012-11-19 NOTE — Progress Notes (Signed)
Physical Therapy Treatment Patient Details Name: Veronica Thomas MRN: [id number] DOB: [birth year] Today's Date: 11/19/2012 Time: 6962-9528 PT Time Calculation (min): 33 min  PT Assessment / Plan / Recommendation Comments on Treatment Session  Able to transfer OOB, and take a few steps; Pt painful, but seemed encouraged at end of session; Placed pillows to support LUE and to splint R ribs for coughing; Encouraged incentive spirometer    Follow Up Recommendations  CIR     Does the patient have the potential to tolerate intense rehabilitation     Barriers to Discharge        Equipment Recommendations  Other (comment) (TBD)    Recommendations for Other Services Rehab consult  Frequency Min 5X/week   Plan Discharge plan remains appropriate    Precautions / Restrictions Precautions Precautions: Fall;Cervical Precaution Comments: Reviewed cervical precautions with patient and discussed need for cervical brace to keep neck/head still. Required Braces or Orthoses: Cervical Brace Cervical Brace: Hard collar Restrictions Weight Bearing Restrictions: Yes LUE Weight Bearing: Non weight bearing   Pertinent Vitals/Pain 9/10 pain, mostly UE, and ribs; was premedicated for pain    Mobility  Bed Mobility Bed Mobility: Supine to Sit;Scooting to HOB Supine to Sit: 1: +2 Total assist;With rails;HOB elevated Supine to Sit: Patient Percentage: 30% Details for Bed Mobility Assistance: Verbal CUes for sequence and technique; LUE supported at all times; Required +2 to elevate trunk form bed, but very good job of moving LEs off of bed Transfers Transfers: Sit to Stand;Stand to Sit Sit to Stand: 3: Mod assist;From bed (second person helpful for lines/leads/pt confidence) Stand to Sit: 3: Mod assist Details for Transfer Assistance: Cues for technique; LUE supported during transfer (but minimized any WBing); Mod assist, especially to control descent with stand to sit Ambulation/Gait Ambulation/Gait  Assistance: 3: Mod assist Ambulation Distance (Feet): 3 Feet (pivot step bed to chair and march in place in front of chair) Assistive device: 1 person hand held assist Ambulation/Gait Assistance Details: physical assist mostly for steadiness and to support LUE Gait Pattern: Decreased step length - right;Decreased step length - left    Exercises     PT Diagnosis:    PT Problem List:   PT Treatment Interventions:     PT Goals Acute Rehab PT Goals PT Goal Formulation: With patient Time For Goal Achievement: 11/25/12 Potential to Achieve Goals: Good Pt will go Supine/Side to Sit: with min assist;with HOB not 0 degrees (comment degree);with rail PT Goal: Supine/Side to Sit - Progress: Progressing toward goal Pt will Sit at Edge of Bed: with supervision;6-10 min;with no upper extremity support PT Goal: Sit at Edge Of Bed - Progress: Progressing toward goal Pt will go Sit to Supine/Side: with min assist;with HOB not 0 degrees (comment degree);with rail Pt will go Sit to Stand: with min assist;with upper extremity assist PT Goal: Sit to Stand - Progress: Progressing toward goal Pt will Transfer Bed to Chair/Chair to Bed: with min assist PT Transfer Goal: Bed to Chair/Chair to Bed - Progress: Progressing toward goal Pt will Ambulate: 51 - 150 feet;with min assist;with least restrictive assistive device PT Goal: Ambulate - Progress: Progressing toward goal  Visit Information  Last PT Received On: 11/19/12 Assistance Needed: +2    Subjective Data  Subjective: Anxious, but agreeable to OOB Patient Stated Goal: To get better.   Cognition  Cognition Arousal/Alertness: Awake/alert Behavior During Therapy: Anxious Overall Cognitive Status: Within Functional Limits for tasks assessed    Balance     End  of Session PT - End of Session Equipment Utilized During Treatment: Oxygen Activity Tolerance: Patient limited by pain Patient left: in bed;with call bell/phone within reach;with nursing  in room Nurse Communication: Mobility status;Patient requests pain meds   GP     [place], [facility] [place], [place] [phone]  11/19/2012, 12:54 PM

## 2012-11-19 NOTE — Progress Notes (Signed)
Films reviewed  Not poor alignment, will try to continue treating in closed manner Continue with splint Can follow as outpatient may require surgery if angulatory and displacement occurs but not significantly angulated Large butterfly fragment but relative good position

## 2012-11-19 NOTE — Progress Notes (Signed)
Pt removed her cervical collar.   Collar replaced- pt denies numbness,tingling,grips equal,moves legs.  .Stressed  To patient importance of wearing collar to stabilize her spine. She agrees to wear it.
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
## 2012-11-19 NOTE — Progress Notes (Signed)
Continue pulmonary toilet and PT.  Humerus disposition needed.

## 2012-11-19 NOTE — Progress Notes (Signed)
Subjective: Patient reports improving  Objective: Vital signs in last 24 hours: Temp:  [97.7 F (36.5 C)-99.7 F (37.6 C)] 97.7 F (36.5 C) (06/15 0555) Pulse Rate:  [74-109] 78 (06/15 0555) Resp:  [16-20] 18 (06/15 0555) BP: (103-118)/(62-71) 106/69 mmHg (06/15 0555) SpO2:  [90 %-98 %] 98 % (06/15 0555)  Intake/Output from previous day: 06/14 0701 - 06/15 0700 In: 1000 [P.O.:360; I.V.:440; IV Piggyback:200] Out: 3500 [Urine:3500] Intake/Output this shift:    Physical Exam: Collar fitting well.  Dressing CDI.  No complaints of pain/weakness in upper extremities.  Lab Results:  Recent Labs  11/17/12 0540  WBC 15.1*  HGB 11.6*  HCT 32.9*  PLT 162   BMET  Recent Labs  11/17/12 0540  NA 136  K 4.1  CL 99  CO2 27  GLUCOSE 266*  BUN 8  CREATININE 0.54  CALCIUM 9.0    Studies/Results: No results found.  Assessment/Plan: Improving following cervical corpectomy.  Continue care on Trauma service.  Dr. Yetta Barre to follow in am.     LOS: 4 days    Dorian Heckle, MD 11/19/2012, 7:44 AM

## 2012-11-20 ENCOUNTER — Inpatient Hospital Stay (HOSPITAL_COMMUNITY): Payer: No Typology Code available for payment source

## 2012-11-20 DIAGNOSIS — S12400A Unspecified displaced fracture of fifth cervical vertebra, initial encounter for closed fracture: Secondary | ICD-10-CM

## 2012-11-20 DIAGNOSIS — S069X9A Unspecified intracranial injury with loss of consciousness of unspecified duration, initial encounter: Secondary | ICD-10-CM

## 2012-11-20 DIAGNOSIS — S12400B Unspecified displaced fracture of fifth cervical vertebra, initial encounter for open fracture: Secondary | ICD-10-CM

## 2012-11-20 DIAGNOSIS — S42309A Unspecified fracture of shaft of humerus, unspecified arm, initial encounter for closed fracture: Secondary | ICD-10-CM

## 2012-11-20 DIAGNOSIS — S2239XA Fracture of one rib, unspecified side, initial encounter for closed fracture: Secondary | ICD-10-CM

## 2012-11-20 LAB — CBC
Hemoglobin: 12.9 g/dL (ref 12.0–15.0)
MCH: 28.7 pg (ref 26.0–34.0)
RBC: 4.49 MIL/uL (ref 3.87–5.11)

## 2012-11-20 LAB — GLUCOSE, CAPILLARY
Glucose-Capillary: 254 mg/dL — ABNORMAL HIGH (ref 70–99)
Glucose-Capillary: 335 mg/dL — ABNORMAL HIGH (ref 70–99)

## 2012-11-20 MED ORDER — INSULIN GLARGINE 100 UNIT/ML ~~LOC~~ SOLN
15.0000 [IU] | Freq: Two times a day (BID) | SUBCUTANEOUS | Status: DC
  Administered 2012-11-20 – 2012-11-21 (×2): 15 [IU] via SUBCUTANEOUS
  Filled 2012-11-20 (×3): qty 0.15

## 2012-11-20 MED ORDER — INSULIN GLARGINE 100 UNIT/ML ~~LOC~~ SOLN
20.0000 [IU] | Freq: Every day | SUBCUTANEOUS | Status: DC
Start: 2012-11-20 — End: 2012-11-20
  Administered 2012-11-20: 20 [IU] via SUBCUTANEOUS
  Filled 2012-11-20: qty 0.2

## 2012-11-20 MED ORDER — TRAMADOL HCL 50 MG PO TABS
100.0000 mg | ORAL_TABLET | Freq: Four times a day (QID) | ORAL | Status: DC
  Administered 2012-11-20 – 2012-11-23 (×10): 100 mg via ORAL
  Filled 2012-11-20 (×19): qty 2

## 2012-11-20 MED ORDER — NAPROXEN 250 MG PO TABS
500.0000 mg | ORAL_TABLET | Freq: Two times a day (BID) | ORAL | Status: DC
  Administered 2012-11-20 – 2012-11-23 (×6): 500 mg via ORAL
  Filled 2012-11-20: qty 1
  Filled 2012-11-20 (×4): qty 2
  Filled 2012-11-20: qty 1
  Filled 2012-11-20 (×6): qty 2

## 2012-11-20 MED ORDER — MORPHINE SULFATE ER 30 MG PO TBCR
30.0000 mg | EXTENDED_RELEASE_TABLET | Freq: Two times a day (BID) | ORAL | Status: DC
  Administered 2012-11-20 – 2012-11-21 (×3): 30 mg via ORAL
  Filled 2012-11-20 (×3): qty 1

## 2012-11-20 MED ORDER — HYDROMORPHONE HCL PF 1 MG/ML IJ SOLN
0.5000 mg | INTRAMUSCULAR | Status: DC | PRN
  Administered 2012-11-20 – 2012-11-23 (×14): 0.5 mg via INTRAVENOUS
  Filled 2012-11-20 (×14): qty 1

## 2012-11-20 MED ORDER — POLYETHYLENE GLYCOL 3350 17 G PO PACK
17.0000 g | PACK | Freq: Every day | ORAL | Status: DC
  Administered 2012-11-20 – 2012-11-22 (×3): 17 g via ORAL
  Filled 2012-11-20 (×4): qty 1

## 2012-11-20 MED ORDER — DOCUSATE SODIUM 100 MG PO CAPS
200.0000 mg | ORAL_CAPSULE | Freq: Two times a day (BID) | ORAL | Status: DC
  Administered 2012-11-20 – 2012-11-22 (×6): 200 mg via ORAL
  Filled 2012-11-20 (×2): qty 2
  Filled 2012-11-20: qty 1
  Filled 2012-11-20: qty 2
  Filled 2012-11-20: qty 1
  Filled 2012-11-20 (×2): qty 2
  Filled 2012-11-20 (×2): qty 1

## 2012-11-20 MED ORDER — OXYCODONE HCL 5 MG PO TABS
10.0000 mg | ORAL_TABLET | ORAL | Status: DC | PRN
  Administered 2012-11-20: 20 mg via ORAL
  Administered 2012-11-20: 15 mg via ORAL
  Administered 2012-11-20 – 2012-11-23 (×11): 20 mg via ORAL
  Filled 2012-11-20 (×2): qty 4
  Filled 2012-11-20: qty 2
  Filled 2012-11-20 (×6): qty 4
  Filled 2012-11-20: qty 2
  Filled 2012-11-20 (×2): qty 4
  Filled 2012-11-20: qty 3
  Filled 2012-11-20 (×2): qty 4

## 2012-11-20 MED ORDER — OXYMORPHONE HCL ER 10 MG PO T12A: 10.0000 mg | EXTENDED_RELEASE_TABLET | Freq: Two times a day (BID) | ORAL | Status: DC
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
## 2012-11-20 NOTE — Progress Notes (Signed)
Orthopedic Tech Progress Note Patient Details:  Veronica Thomas 19-Jan-1976 [id number] Arm sling applied. Tolerated well.  Ortho Devices Type of Ortho Device: Arm sling Ortho Device/Splint Location: LUE Ortho Device/Splint Interventions: Application   Asia R Thompson 11/20/2012, 8:54 AM
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
## 2012-11-20 NOTE — Progress Notes (Signed)
Rehab Admissions Coordinator Note:  Patient was screened by Brock Ra for appropriateness for an Inpatient Acute Rehab Consult.  At this time, we are recommending Inpatient Rehab consult.  Brock Ra 11/20/2012, 9:37 AM  I can be reached at [phone].

## 2012-11-20 NOTE — Progress Notes (Signed)
Physical Therapy Treatment Patient Details Name: Veronica Thomas MRN: [id number] DOB: May 16, 1976 Today's Date: 11/20/2012 Time: 0865-7846 PT Time Calculation (min): 38 min  PT Assessment / Plan / Recommendation Comments on Treatment Session  Continuing improvements, with increased amb distance; Very supportive sister; should do well at CIR; With noted head trauma, pt qualifies for OT, ST, and PT    Follow Up Recommendations  CIR     Does the patient have the potential to tolerate intense rehabilitation     Barriers to Discharge        Equipment Recommendations       Recommendations for Other Services Rehab consult  Frequency Min 5X/week   Plan Discharge plan remains appropriate    Precautions / Restrictions Precautions Precautions: Fall;Cervical Precaution Comments: Reviewed cervical precautions with patient and discussed need for cervical brace to keep neck/head still. Required Braces or Orthoses: Cervical Brace Cervical Brace: Hard collar (repositioned for better fit) Restrictions LUE Weight Bearing: Non weight bearing   Pertinent Vitals/Pain RN provided medication to assist with pain control 9/10 neck, back post R ribs, LUE    Mobility  Bed Mobility Bed Mobility: Supine to Sit;Sitting - Scoot to Edge of Bed Supine to Sit: HOB elevated;3: Mod assist Sitting - Scoot to Edge of Bed: 3: Mod assist Details for Bed Mobility Assistance: Mod assist largely for supporting LUE and rib fractures during transitional movements Transfers Transfers: Sit to Stand;Stand to Sit Sit to Stand: 3: Mod assist;From bed (second person helpful for pt confidence) Stand to Sit: 3: Mod assist Details for Transfer Assistance: Continued cues for technique; Assist for supprot of RUE, and for supporting/stabilizing sore back/ribs/LUE; Cues to push through feet, and to extend hips and trunk to come to fully upright posture Ambulation/Gait Ambulation/Gait Assistance: 3: Mod assist Ambulation Distance  (Feet): 30 Feet (15x2 to/from bathroom) Assistive device: 1 person hand held assist (and secondperson for lines) Ambulation/Gait Assistance Details: Physical assist for steadiness, support; Pt able to shift weight and step relatively high over her foley line; Much encouragement to incr amb distance Gait Pattern: Decreased stride length (Erratic step width)    Exercises     PT Diagnosis:    PT Problem List:   PT Treatment Interventions:     PT Goals Acute Rehab PT Goals Time For Goal Achievement: 11/25/12 Potential to Achieve Goals: Good Pt will go Supine/Side to Sit: with min assist;with HOB not 0 degrees (comment degree);with rail PT Goal: Supine/Side to Sit - Progress: Progressing toward goal Pt will Sit at Edge of Bed: with supervision;6-10 min;with no upper extremity support PT Goal: Sit at Edge Of Bed - Progress: Progressing toward goal Pt will go Sit to Stand: with min assist;with upper extremity assist PT Goal: Sit to Stand - Progress: Progressing toward goal Pt will Transfer Bed to Chair/Chair to Bed: with min assist PT Transfer Goal: Bed to Chair/Chair to Bed - Progress: Progressing toward goal Pt will Ambulate: 51 - 150 feet;with min assist;with least restrictive assistive device PT Goal: Ambulate - Progress: Progressing toward goal  Visit Information  Last PT Received On: 11/20/12 Assistance Needed:  (is helpful; hopefully +1 soon) PT/OT Co-Evaluation/Treatment: Yes    Subjective Data  Subjective: Anxious, but agreeable to OOB; sister, Ginger present and very encourageing   Cognition  Cognition Arousal/Alertness: Awake/alert Behavior During Therapy: Anxious Overall Cognitive Status: Within Functional Limits for tasks assessed (for simple tasks)    Balance     End of Session PT - End of Session Activity  Tolerance: Patient tolerated treatment well;Patient limited by pain (able to progress activity despite paina nd tears) Patient left: in chair;with call bell/phone  within reach;with family/visitor present Nurse Communication: Mobility status;Patient requests pain meds   GP     Veronica, Thomas Veronica Thomas, Veronica Thomas [phone]  11/20/2012, 4:11 PM
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
## 2012-11-21 ENCOUNTER — Encounter (HOSPITAL_COMMUNITY): Payer: Self-pay | Admitting: Neurological Surgery

## 2012-11-21 ENCOUNTER — Inpatient Hospital Stay (HOSPITAL_COMMUNITY): Payer: No Typology Code available for payment source

## 2012-11-21 LAB — CBC
HCT: 32.4 % — ABNORMAL LOW (ref 36.0–46.0)
MCH: 27.6 pg (ref 26.0–34.0)
MCV: 79.8 fL (ref 78.0–100.0)
RDW: 14.5 % (ref 11.5–15.5)
WBC: 17.7 10*3/uL — ABNORMAL HIGH (ref 4.0–10.5)

## 2012-11-21 LAB — GLUCOSE, CAPILLARY: Glucose-Capillary: 299 mg/dL — ABNORMAL HIGH (ref 70–99)

## 2012-11-21 MED ORDER — INSULIN GLARGINE 100 UNIT/ML ~~LOC~~ SOLN
20.0000 [IU] | Freq: Two times a day (BID) | SUBCUTANEOUS | Status: DC
  Administered 2012-11-21 – 2012-11-22 (×2): 20 [IU] via SUBCUTANEOUS
  Filled 2012-11-21 (×3): qty 0.2

## 2012-11-21 MED ORDER — NICOTINE 14 MG/24HR TD PT24
14.0000 mg | MEDICATED_PATCH | Freq: Every day | TRANSDERMAL | Status: DC
  Administered 2012-11-21 – 2012-11-23 (×3): 14 mg via TRANSDERMAL
  Filled 2012-11-21 (×3): qty 1

## 2012-11-21 MED ORDER — ENOXAPARIN SODIUM 40 MG/0.4ML ~~LOC~~ SOLN
40.0000 mg | SUBCUTANEOUS | Status: DC
  Administered 2012-11-21 – 2012-11-23 (×3): 40 mg via SUBCUTANEOUS
  Filled 2012-11-21 (×3): qty 0.4

## 2012-11-21 MED ORDER — LORAZEPAM 1 MG PO TABS
1.0000 mg | ORAL_TABLET | ORAL | Status: DC | PRN
  Administered 2012-11-21 – 2012-11-23 (×8): 1 mg via ORAL
  Filled 2012-11-21 (×11): qty 1

## 2012-11-21 MED ORDER — OXYMORPHONE HCL ER 10 MG PO T12A
20.0000 mg | EXTENDED_RELEASE_TABLET | Freq: Two times a day (BID) | ORAL | Status: DC
  Administered 2012-11-21: 20 mg via ORAL
  Filled 2012-11-21 (×2): qty 2

## 2012-11-21 NOTE — Progress Notes (Signed)
Trying opana as this worked well for her in the past when MS contin did not. Appreciate CIR evaluation. Patient examined and I agree with the assessment and plan  Violeta Gelinas, MD, MPH, FACS Pager: [phone]  11/21/2012 11:13 AM

## 2012-11-21 NOTE — Progress Notes (Signed)
Physical Therapy Treatment Patient Details Name: Veronica Thomas MRN: [id number] DOB: 1976-02-15 Today's Date: 11/21/2012 Time: 4098-1191 PT Time Calculation (min): 15 min  PT Assessment / Plan / Recommendation Comments on Treatment Session  Pt cont's to make progress with mobility as evident in ability to increase ambulation distance.  Pt does become emotional & with increased anxiety throughout session but at end of sesison pt thanking therapist for coming to work with her.      Follow Up Recommendations  CIR     Does the patient have the potential to tolerate intense rehabilitation     Barriers to Discharge        Equipment Recommendations       Recommendations for Other Services Rehab consult  Frequency Min 5X/week   Plan Discharge plan remains appropriate    Precautions / Restrictions Precautions Precautions: Fall;Cervical Precaution Comments: Reviewed cervical precautions with patient and discussed need for cervical brace to keep neck/head still. Required Braces or Orthoses: Cervical Brace Cervical Brace: Hard collar Restrictions LUE Weight Bearing: Non weight bearing       Mobility  Bed Mobility Bed Mobility: Not assessed Transfers Transfers: Sit to Stand;Stand to Sit Sit to Stand: 3: Mod assist;With upper extremity assist;From chair/3-in-1 Stand to Sit: 4: Min assist;With upper extremity assist;With armrests;To chair/3-in-1 Details for Transfer Assistance: cues for RUE placement & technique.  (A) to achieve standing & balance.  Pt with increased anxiety-- she began stating "what do you want me to do? just tell me what to do!"  & very emotional.   Ambulation/Gait Ambulation/Gait Assistance: 3: Mod assist Ambulation Distance (Feet): 60 Feet Assistive device: 1 person hand held assist Ambulation/Gait Assistance Details: (A) for steadiness & safety.  Pt with periods of high anxiety & being very emotional.  Required max cueing & attempt to calm her down.   Gait  Pattern: Decreased stride length;Wide base of support Stairs: No Wheelchair Mobility Wheelchair Mobility: No      PT Goals Acute Rehab PT Goals PT Goal Formulation: With patient Time For Goal Achievement: 11/25/12 Potential to Achieve Goals: Good Pt will go Supine/Side to Sit: with min assist;with HOB not 0 degrees (comment degree);with rail Pt will Sit at Edge of Bed: with supervision;6-10 min;with no upper extremity support Pt will go Sit to Supine/Side: with min assist;with HOB not 0 degrees (comment degree);with rail Pt will go Sit to Stand: with min assist;with upper extremity assist PT Goal: Sit to Stand - Progress: Progressing toward goal Pt will Transfer Bed to Chair/Chair to Bed: with min assist Pt will Ambulate: 51 - 150 feet;with min assist;with least restrictive assistive device PT Goal: Ambulate - Progress: Progressing toward goal  Visit Information  Last PT Received On: 11/21/12 Assistance Needed: +2 (safety)    Subjective Data      Cognition  Cognition Arousal/Alertness: Awake/alert Behavior During Therapy: Anxious Overall Cognitive Status: Impaired/Different from baseline Area of Impairment: Problem solving Problem Solving: Slow processing General Comments: Unsure if delays in processing are due to anxiety and pain, or if pt with mild cogntive deficits.  Cognition to be assesed further.  +LOC per ED notes    Balance     End of Session PT - End of Session Equipment Utilized During Treatment: Cervical collar Activity Tolerance: Patient tolerated treatment well;Patient limited by pain Patient left: in chair;with call bell/phone within reach Nurse Communication: Mobility status     Verdell Face, [place] [phone] 11/21/2012
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
## 2012-11-21 NOTE — Progress Notes (Signed)
Patient ID: Veronica Thomas, female   DOB: 04-Aug-1975, 37 y.o.   MRN: [id number] When I walked in her c collar was on the floor, and she stated she thought she took it off some time during the night. "It's the first time it's been off." I explained to her that it needed to be the last time it was off, and explained the importance of compliance with the collar. Dressing removed. Incision looks great. No change neuro exam. Will check an xray of c spine.

## 2012-11-21 NOTE — Progress Notes (Signed)
Inpatient Diabetes Program Recommendations  AACE/ADA: New Consensus Statement on Inpatient Glycemic Control (2013)  Target Ranges:  Prepandial:   less than 140 mg/dL      Peak postprandial:   less than 180 mg/dL (1-2 hours)      Critically ill patients:  140 - 180 mg/dL   Reason for Visit: Hyperglycemia   Results for Veronica Thomas, Veronica Thomas (MRN [id number]) as of 11/21/2012 15:27  Ref. Range 11/19/2012 17:16 11/19/2012 21:45 11/20/2012 07:24 11/20/2012 11:59 11/20/2012 17:02 11/20/2012 22:05 11/21/2012 07:34 11/21/2012 12:02  Glucose-Capillary Latest Range: 70-99 mg/dL 629 (H) 528 (H) 413 (H) 260 (H) 416 (H) 335 (H) 231 (H) 261 (H)      Results for [name], [name] (MRN [id number]) as of 11/21/2012 15:27  Ref. Range 11/18/2012 10:35  Hemoglobin A1C Latest Range: <5.7 % 6.5 (H)   Blood sugars uncontrolled -  231 - 416mg /dL in past 24 hours. Hyperglycemia d/t steroids, ? Control at home. Lantus continues to be uptitrated - 20 units bid.  Continue to follow for glycemic control.    Thank you. Ailene Ards, RD, LDN, CDE Inpatient Diabetes Coordinator [phone]

## 2012-11-21 NOTE — Progress Notes (Signed)
Rehab admissions - Evaluated for possible admission.  I spoke with patient and I gave her rehab booklets.  She says her sister may assist her.  I told her to talk to family to see what assistance she can get.  Currently rehab beds are full.  Will follow progress and try to get her to inpatient rehab in the next couple days.  Call me for questions.  #[phone]
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
## 2012-11-22 ENCOUNTER — Inpatient Hospital Stay (HOSPITAL_COMMUNITY): Payer: No Typology Code available for payment source

## 2012-11-22 DIAGNOSIS — S060XAA Concussion with loss of consciousness status unknown, initial encounter: Secondary | ICD-10-CM

## 2012-11-22 DIAGNOSIS — S0101XA Laceration without foreign body of scalp, initial encounter: Secondary | ICD-10-CM

## 2012-11-22 DIAGNOSIS — S0181XA Laceration without foreign body of other part of head, initial encounter: Secondary | ICD-10-CM

## 2012-11-22 DIAGNOSIS — S060X9A Concussion with loss of consciousness of unspecified duration, initial encounter: Secondary | ICD-10-CM

## 2012-11-22 LAB — GLUCOSE, CAPILLARY
Glucose-Capillary: 234 mg/dL — ABNORMAL HIGH (ref 70–99)
Glucose-Capillary: 201 mg/dL — ABNORMAL HIGH (ref 70–99)

## 2012-11-22 MED ORDER — BISACODYL 5 MG PO TBEC
10.0000 mg | DELAYED_RELEASE_TABLET | Freq: Every day | ORAL | Status: DC
  Administered 2012-11-22: 10 mg via ORAL
  Filled 2012-11-22 (×2): qty 2

## 2012-11-22 MED ORDER — OXYMORPHONE HCL ER 10 MG PO T12A
30.0000 mg | EXTENDED_RELEASE_TABLET | Freq: Two times a day (BID) | ORAL | Status: DC
  Administered 2012-11-22 (×2): 30 mg via ORAL
  Filled 2012-11-22: qty 1
  Filled 2012-11-22: qty 3

## 2012-11-22 MED ORDER — INSULIN GLARGINE 100 UNIT/ML ~~LOC~~ SOLN
25.0000 [IU] | Freq: Two times a day (BID) | SUBCUTANEOUS | Status: DC
  Administered 2012-11-22 – 2012-11-23 (×2): 25 [IU] via SUBCUTANEOUS
  Filled 2012-11-22 (×3): qty 0.25

## 2012-11-22 NOTE — Progress Notes (Signed)
PT Cancellation Note  Patient Details Name: Veronica Thomas MRN: [id number] DOB: 1975/11/17   Cancelled Treatment:    Reason Eval/Treat Not Completed: Other (comment) -- pt distraught and tearful with pain and declined to work with PT.  Unable to return this p.m 11/22/2012  [place] Bing, PT [phone]  (pager)   Daleah Coulson, Eliseo Gum 11/22/2012, 4:35 PM

## 2012-11-22 NOTE — Progress Notes (Signed)
Facial sutures and some staples removed.  Laceration is healing well.  No erythema or drainage.  May remove the remaining staples in 1 week.

## 2012-11-22 NOTE — Progress Notes (Signed)
Pt seen/examined Splint adjusted today pt with improvement in symptoms after adjustment Will continue to try and treat closed Will need to rexray first of next week and if displacement and angulation worsen may require orif Continue with current splint Contact me via cell phone if any questions

## 2012-11-22 NOTE — Clinical Social Work Note (Signed)
Clinical Social Worker continuing to follow patient for support and discharge planning needs.  CSW spoke with patient at bedside who states "I'm doing okay, just want my pain under control."  CSW relayed information to PA regarding patient previous pain medication regimen that worked prior to starting Methadone treatment.  Patient appreciative for information being passed on.    Patient states that her sisters are continuing to provide care to her 19 year old daughter.  Patient states that her family has been more than willing to help and plan to continue to assist at discharge.  Patient plans to admit to inpatient rehab once cleared by ortho.  Patient did not identify with any further social work needs at this time.  Clinical Social Worker will sign off for now as social work intervention is no longer needed. Please consult [place] again if new need arises.  Macario Golds, [place] [phone]
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
## 2012-11-22 NOTE — Progress Notes (Signed)
Rehab admissions - I met with patient and she would like to come to inpatient rehab.  She continues to complain about left arm pain and says she cannot move it as well as in beginning of hospital stay.  I talked to Dale  and he will get in touch with ortho MD about the left arm.  I will hold on planned admission to inpatient rehab until cleared by ortho MD.  Call me for questions.  #[phone]

## 2012-11-22 NOTE — Progress Notes (Signed)
UR completed 
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
## 2012-11-23 ENCOUNTER — Inpatient Hospital Stay (HOSPITAL_COMMUNITY)
Admission: AD | Admit: 2012-11-23 | Discharge: 2012-11-30 | DRG: 945 | Disposition: A | Discharge status: Home Health | Payer: No Typology Code available for payment source | Source: Intra-hospital | Attending: Physical Medicine & Rehabilitation | Admitting: Physical Medicine & Rehabilitation

## 2012-11-23 ENCOUNTER — Encounter: Payer: Self-pay | Admitting: Obstetrics & Gynecology

## 2012-11-23 ENCOUNTER — Encounter (HOSPITAL_COMMUNITY): Payer: Self-pay | Admitting: *Deleted

## 2012-11-23 DIAGNOSIS — E1142 Type 2 diabetes mellitus with diabetic polyneuropathy: Secondary | ICD-10-CM | POA: Diagnosis present

## 2012-11-23 DIAGNOSIS — Z5189 Encounter for other specified aftercare: Principal | ICD-10-CM

## 2012-11-23 DIAGNOSIS — S42309A Unspecified fracture of shaft of humerus, unspecified arm, initial encounter for closed fracture: Secondary | ICD-10-CM | POA: Diagnosis present

## 2012-11-23 DIAGNOSIS — E1149 Type 2 diabetes mellitus with other diabetic neurological complication: Secondary | ICD-10-CM | POA: Diagnosis present

## 2012-11-23 DIAGNOSIS — K5900 Constipation, unspecified: Secondary | ICD-10-CM | POA: Diagnosis present

## 2012-11-23 DIAGNOSIS — B1920 Unspecified viral hepatitis C without hepatic coma: Secondary | ICD-10-CM | POA: Diagnosis present

## 2012-11-23 DIAGNOSIS — S069X9A Unspecified intracranial injury with loss of consciousness of unspecified duration, initial encounter: Secondary | ICD-10-CM

## 2012-11-23 DIAGNOSIS — S51809A Unspecified open wound of unspecified forearm, initial encounter: Secondary | ICD-10-CM | POA: Diagnosis present

## 2012-11-23 DIAGNOSIS — S0100XA Unspecified open wound of scalp, initial encounter: Secondary | ICD-10-CM | POA: Diagnosis present

## 2012-11-23 DIAGNOSIS — E119 Type 2 diabetes mellitus without complications: Secondary | ICD-10-CM

## 2012-11-23 DIAGNOSIS — S12400A Unspecified displaced fracture of fifth cervical vertebra, initial encounter for closed fracture: Secondary | ICD-10-CM | POA: Diagnosis present

## 2012-11-23 DIAGNOSIS — IMO0002 Reserved for concepts with insufficient information to code with codable children: Secondary | ICD-10-CM | POA: Diagnosis present

## 2012-11-23 DIAGNOSIS — G8929 Other chronic pain: Secondary | ICD-10-CM

## 2012-11-23 DIAGNOSIS — S06330A Contusion and laceration of cerebrum, unspecified, without loss of consciousness, initial encounter: Secondary | ICD-10-CM | POA: Diagnosis present

## 2012-11-23 DIAGNOSIS — T07XXXA Unspecified multiple injuries, initial encounter: Secondary | ICD-10-CM

## 2012-11-23 DIAGNOSIS — G894 Chronic pain syndrome: Secondary | ICD-10-CM | POA: Diagnosis present

## 2012-11-23 DIAGNOSIS — S2249XA Multiple fractures of ribs, unspecified side, initial encounter for closed fracture: Secondary | ICD-10-CM | POA: Diagnosis present

## 2012-11-23 DIAGNOSIS — F17200 Nicotine dependence, unspecified, uncomplicated: Secondary | ICD-10-CM | POA: Diagnosis present

## 2012-11-23 DIAGNOSIS — S0633AA Contusion and laceration of cerebrum, unspecified, with loss of consciousness status unknown, initial encounter: Secondary | ICD-10-CM | POA: Diagnosis present

## 2012-11-23 DIAGNOSIS — S069XAA Unspecified intracranial injury with loss of consciousness status unknown, initial encounter: Secondary | ICD-10-CM

## 2012-11-23 LAB — CBC
MCH: 28 pg (ref 26.0–34.0)
MCHC: 34.2 g/dL (ref 30.0–36.0)
MCV: 82 fL (ref 78.0–100.0)
Platelets: 350 10*3/uL (ref 150–400)
RDW: 15.5 % (ref 11.5–15.5)

## 2012-11-23 LAB — GLUCOSE, CAPILLARY: Glucose-Capillary: 128 mg/dL — ABNORMAL HIGH (ref 70–99)

## 2012-11-23 LAB — CREATININE, SERUM: Creatinine, Ser: 0.69 mg/dL (ref 0.50–1.10)

## 2012-11-23 MED ORDER — INSULIN GLARGINE 100 UNIT/ML ~~LOC~~ SOLN
25.0000 [IU] | Freq: Two times a day (BID) | SUBCUTANEOUS | Status: DC
  Administered 2012-11-23 – 2012-11-27 (×8): 25 [IU] via SUBCUTANEOUS
  Filled 2012-11-23 (×9): qty 0.25

## 2012-11-23 MED ORDER — TRAMADOL HCL 50 MG PO TABS
100.0000 mg | ORAL_TABLET | Freq: Four times a day (QID) | ORAL | Status: DC
  Administered 2012-11-23 – 2012-11-24 (×4): 100 mg via ORAL
  Filled 2012-11-23 (×5): qty 2

## 2012-11-23 MED ORDER — DOCUSATE SODIUM 100 MG PO CAPS
200.0000 mg | ORAL_CAPSULE | Freq: Two times a day (BID) | ORAL | Status: DC
  Administered 2012-11-23 – 2012-11-30 (×7): 200 mg via ORAL
  Filled 2012-11-23 (×17): qty 2

## 2012-11-23 MED ORDER — ENOXAPARIN SODIUM 40 MG/0.4ML ~~LOC~~ SOLN: 40.0000 mg | SUBCUTANEOUS | Status: DC

## 2012-11-23 MED ORDER — SORBITOL 70 % SOLN: 30.0000 mL | Freq: Every day | Status: DC | PRN

## 2012-11-23 MED ORDER — ONDANSETRON HCL 4 MG PO TABS: 4.0000 mg | ORAL_TABLET | Freq: Four times a day (QID) | ORAL | Status: DC | PRN

## 2012-11-23 MED ORDER — MAGNESIUM CITRATE PO SOLN
1.0000 | Freq: Once | ORAL | Status: AC
  Administered 2012-11-23: 1 via ORAL
  Filled 2012-11-23: qty 296

## 2012-11-23 MED ORDER — ENOXAPARIN SODIUM 40 MG/0.4ML ~~LOC~~ SOLN
40.0000 mg | SUBCUTANEOUS | Status: DC
  Administered 2012-11-24 – 2012-11-30 (×7): 40 mg via SUBCUTANEOUS
  Filled 2012-11-23 (×8): qty 0.4

## 2012-11-23 MED ORDER — ONDANSETRON HCL 4 MG/2ML IJ SOLN: 4.0000 mg | Freq: Four times a day (QID) | INTRAMUSCULAR | Status: DC | PRN

## 2012-11-23 MED ORDER — OXYCODONE HCL 5 MG PO TABS
10.0000 mg | ORAL_TABLET | ORAL | Status: DC | PRN
  Administered 2012-11-23 – 2012-11-30 (×28): 20 mg via ORAL
  Filled 2012-11-23 (×20): qty 4
  Filled 2012-11-23: qty 2
  Filled 2012-11-23 (×8): qty 4
  Filled 2012-11-23: qty 2

## 2012-11-23 MED ORDER — OXYMORPHONE HCL ER 10 MG PO T12A
40.0000 mg | EXTENDED_RELEASE_TABLET | Freq: Two times a day (BID) | ORAL | Status: DC
  Administered 2012-11-23: 40 mg via ORAL
  Filled 2012-11-23: qty 4

## 2012-11-23 MED ORDER — POLYETHYLENE GLYCOL 3350 17 G PO PACK
17.0000 g | PACK | Freq: Every day | ORAL | Status: DC
  Administered 2012-11-27 – 2012-11-30 (×3): 17 g via ORAL
  Filled 2012-11-23 (×8): qty 1

## 2012-11-23 MED ORDER — OXYMORPHONE HCL ER 10 MG PO T12A
40.0000 mg | EXTENDED_RELEASE_TABLET | Freq: Two times a day (BID) | ORAL | Status: DC
  Administered 2012-11-23 – 2012-11-30 (×14): 40 mg via ORAL
  Filled 2012-11-23 (×14): qty 4

## 2012-11-23 MED ORDER — INSULIN ASPART 100 UNIT/ML ~~LOC~~ SOLN
0.0000 [IU] | Freq: Three times a day (TID) | SUBCUTANEOUS | Status: DC
  Administered 2012-11-23: 11 [IU] via SUBCUTANEOUS
  Administered 2012-11-24: 4 [IU] via SUBCUTANEOUS
  Administered 2012-11-24: 7 [IU] via SUBCUTANEOUS
  Administered 2012-11-24 – 2012-11-25 (×2): 4 [IU] via SUBCUTANEOUS
  Administered 2012-11-25 – 2012-11-26 (×3): 7 [IU] via SUBCUTANEOUS
  Administered 2012-11-26 – 2012-11-27 (×3): 3 [IU] via SUBCUTANEOUS
  Administered 2012-11-27 – 2012-11-28 (×2): 4 [IU] via SUBCUTANEOUS
  Administered 2012-11-28: 7 [IU] via SUBCUTANEOUS
  Administered 2012-11-28: 3 [IU] via SUBCUTANEOUS
  Administered 2012-11-29: 7 [IU] via SUBCUTANEOUS
  Administered 2012-11-29 – 2012-11-30 (×3): 4 [IU] via SUBCUTANEOUS

## 2012-11-23 MED ORDER — LORAZEPAM 1 MG PO TABS
1.0000 mg | ORAL_TABLET | ORAL | Status: DC | PRN
  Administered 2012-11-23 – 2012-11-30 (×22): 1 mg via ORAL
  Filled 2012-11-23 (×24): qty 1

## 2012-11-23 MED ORDER — NAPROXEN 500 MG PO TABS
500.0000 mg | ORAL_TABLET | Freq: Two times a day (BID) | ORAL | Status: DC
  Administered 2012-11-23 – 2012-11-30 (×14): 500 mg via ORAL
  Filled 2012-11-23 (×16): qty 1

## 2012-11-23 MED ORDER — ACETAMINOPHEN 325 MG PO TABS
325.0000 mg | ORAL_TABLET | ORAL | Status: DC | PRN
  Administered 2012-11-26: 650 mg via ORAL
  Filled 2012-11-23: qty 2

## 2012-11-23 MED ORDER — NICOTINE 14 MG/24HR TD PT24
14.0000 mg | MEDICATED_PATCH | Freq: Every day | TRANSDERMAL | Status: DC
  Administered 2012-11-24 – 2012-11-30 (×7): 14 mg via TRANSDERMAL
  Filled 2012-11-23 (×8): qty 1

## 2012-11-23 MED ORDER — METHOCARBAMOL 500 MG PO TABS
500.0000 mg | ORAL_TABLET | Freq: Four times a day (QID) | ORAL | Status: DC | PRN
  Administered 2012-11-24 – 2012-11-29 (×7): 500 mg via ORAL
  Filled 2012-11-23 (×8): qty 1
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
## 2012-11-23 NOTE — Progress Notes (Signed)
Rehab admissions - Bed available and can admit to acute inpatient rehab today.  Call me for questions.  #[phone]

## 2012-11-23 NOTE — Progress Notes (Signed)
Patient transferred to 4000.

## 2012-11-23 NOTE — Progress Notes (Signed)
This patient has been seen and I agree with the findings and treatment plan.  Omarri Eich O. Faithe Ariola, III, MD, FACS [phone] (pager) [phone] (direct pager) Trauma Surgeon  
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
## 2012-11-23 NOTE — Progress Notes (Signed)
Report given to 4000.
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
## 2012-11-23 NOTE — Discharge Summary (Signed)
This patient has been seen and I agree with the findings and treatment plan.  Duante Arocho O. Jonnie Kubly, III, MD, FACS [phone] (pager) [phone] (direct pager) Trauma Surgeon  
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
## 2012-11-24 ENCOUNTER — Inpatient Hospital Stay (HOSPITAL_COMMUNITY): Payer: Self-pay | Admitting: Occupational Therapy

## 2012-11-24 ENCOUNTER — Inpatient Hospital Stay (HOSPITAL_COMMUNITY): Payer: No Typology Code available for payment source | Admitting: Physical Therapy

## 2012-11-24 ENCOUNTER — Inpatient Hospital Stay (HOSPITAL_COMMUNITY): Payer: No Typology Code available for payment source | Admitting: Speech Pathology

## 2012-11-24 DIAGNOSIS — S069X9A Unspecified intracranial injury with loss of consciousness of unspecified duration, initial encounter: Secondary | ICD-10-CM

## 2012-11-24 LAB — CBC WITH DIFFERENTIAL/PLATELET
Basophils Relative: 0 % (ref 0–1)
Eosinophils Absolute: 0.3 10*3/uL (ref 0.0–0.7)
HCT: 34.9 % — ABNORMAL LOW (ref 36.0–46.0)
Hemoglobin: 12.1 g/dL (ref 12.0–15.0)
Lymphs Abs: 4.9 10*3/uL — ABNORMAL HIGH (ref 0.7–4.0)
MCH: 28.1 pg (ref 26.0–34.0)
MCHC: 34.7 g/dL (ref 30.0–36.0)
Monocytes Absolute: 1.3 10*3/uL — ABNORMAL HIGH (ref 0.1–1.0)
Monocytes Relative: 6 % (ref 3–12)
Neutrophils Relative %: 71 % (ref 43–77)
RBC: 4.3 MIL/uL (ref 3.87–5.11)

## 2012-11-24 LAB — COMPREHENSIVE METABOLIC PANEL
Albumin: 2.8 g/dL — ABNORMAL LOW (ref 3.5–5.2)
Alkaline Phosphatase: 99 U/L (ref 39–117)
BUN: 24 mg/dL — ABNORMAL HIGH (ref 6–23)
Chloride: 100 mEq/L (ref 96–112)
Creatinine, Ser: 0.49 mg/dL — ABNORMAL LOW (ref 0.50–1.10)
GFR calc Af Amer: >90 mL/min (ref >90)
Glucose, Bld: 166 mg/dL — ABNORMAL HIGH (ref 70–99)
Potassium: 3.6 mEq/L (ref 3.5–5.1)
Total Bilirubin: 0.5 mg/dL (ref 0.3–1.2)
Total Protein: 6.2 g/dL (ref 6.0–8.3)

## 2012-11-24 LAB — GLUCOSE, CAPILLARY
Glucose-Capillary: 192 mg/dL — ABNORMAL HIGH (ref 70–99)
Glucose-Capillary: 181 mg/dL — ABNORMAL HIGH (ref 70–99)
Glucose-Capillary: 204 mg/dL — ABNORMAL HIGH (ref 70–99)

## 2012-11-24 MED ORDER — GLUCERNA SHAKE PO LIQD
120.0000 mL | Freq: Three times a day (TID) | ORAL | Status: DC
  Administered 2012-11-24 – 2012-11-26 (×10): 120 mL via ORAL
  Administered 2012-11-27 (×2): 237 mL via ORAL
  Administered 2012-11-27: 120 mL via ORAL
  Administered 2012-11-28 (×2): 237 mL via ORAL
  Administered 2012-11-28: 120 mL via ORAL
  Administered 2012-11-28: 237 mL via ORAL
  Administered 2012-11-29 – 2012-11-30 (×5): 120 mL via ORAL

## 2012-11-24 MED ORDER — TRAMADOL HCL 50 MG PO TABS
100.0000 mg | ORAL_TABLET | Freq: Three times a day (TID) | ORAL | Status: DC
  Administered 2012-11-24 – 2012-11-30 (×19): 100 mg via ORAL
  Filled 2012-11-24 (×21): qty 2
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
## 2012-11-24 NOTE — Progress Notes (Signed)
VASCULAR LAB  Lower extremity venous duplex attempted.  Patient refuses today.  Agrees to test tomorrow.      Stanislav Gervase, RVT 11/24/2012, 3:23 PM
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
## 2012-11-24 NOTE — Progress Notes (Signed)
Patient information reviewed and entered into eRehab system by Reeve Mallo, RN, CRRN, PPS Coordinator.  Information including medical coding and functional independence measure will be reviewed and updated through discharge.     Per nursing patient was given "Data Collection Information Summary for Patients in Inpatient Rehabilitation Facilities with attached "Privacy Act Statement-Health Care Records" upon admission.  
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
## 2012-11-24 NOTE — Progress Notes (Signed)
Physical Therapy Session Note  Patient Details  Name: Veronica Thomas MRN: [id number] Date of Birth: November 21, 1975  Today's Date: 11/24/2012 Time: 4696-2952 Time Calculation (min): 41 min  Short Term Goals: Week 1:  PT Short Term Goal 1 (Week 1): Pt will perform functional transfers with supervision PT Short Term Goal 2 (Week 1): Pt will gait in controlled environment 100' with supervision  Skilled Therapeutic Interventions/Progress Updates:  Gait training without AD with pt with less anxiety.  Pt requires min steadying assist during gait, able to gait >100' before fatigued.  Berg balance test performed.  Pt scored 27/56.  Pt aware of balance deficits and need for assistance with mobility at this time.  Attempted to continue session with standing balance training.  Pt reports she feels too fatigued and in pain to continue.  Pt assisted back to bed with supervision for bed mobility with head of bed raised due to c/o back pain when lying flat.  Pt with good tolerance to >4 hours of therapy on her first day.  Therapy Documentation Precautions:  Precautions Precautions: Fall;Cervical Precaution Comments: Reviewed cervical precautions with patient and discussed need for cervical brace to keep neck/head still. Required Braces or Orthoses: Cervical Brace Cervical Brace: Hard collar Restrictions Weight Bearing Restrictions: Yes LUE Weight Bearing: Non weight bearing Other Position/Activity Restrictions: sling for comfort Pain: Pt c/o 9/10 pain in L UE and rib cage, RN aware, repositioned for comfort (pt prefers not to wear sling this treatment) Balance: Balance Balance Assessed: Yes Standardized Balance Assessment Standardized Balance Assessment: Berg Balance Test Berg Balance Test Sit to Stand: Able to stand  independently using hands Standing Unsupported: Able to stand safely 2 minutes Sitting with Back Unsupported but Feet Supported on Floor or Stool: Able to sit safely and securely 2  minutes Stand to Sit: Controls descent by using hands Transfers: Able to transfer with verbal cueing and /or supervision Standing Unsupported with Eyes Closed: Able to stand 10 seconds with supervision Standing Ubsupported with Feet Together: Needs help to attain position but able to stand for 30 seconds with feet together From Standing, Reach Forward with Outstretched Arm: Reaches forward but needs supervision From Standing Position, Pick up Object from Floor: Unable to pick up and needs supervision From Standing Position, Turn to Look Behind Over each Shoulder: Turn sideways only but maintains balance Turn 360 Degrees: Needs close supervision or verbal cueing Standing Unsupported, Alternately Place Feet on Step/Stool: Able to complete >2 steps/needs minimal assist Standing Unsupported, One Foot in Front: Needs help to step but can hold 15 seconds Standing on One Leg: Unable to try or needs assist to prevent fall Total Score: 27   See FIM for current functional status  Therapy/Group: Individual Therapy  Kieren Ricci 11/24/2012, 2:28 PM
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
## 2012-11-25 ENCOUNTER — Inpatient Hospital Stay (HOSPITAL_COMMUNITY): Payer: No Typology Code available for payment source | Admitting: *Deleted

## 2012-11-25 ENCOUNTER — Inpatient Hospital Stay (HOSPITAL_COMMUNITY): Payer: Self-pay

## 2012-11-25 ENCOUNTER — Inpatient Hospital Stay (HOSPITAL_COMMUNITY): Payer: No Typology Code available for payment source | Admitting: Speech Pathology

## 2012-11-25 DIAGNOSIS — T07XXXA Unspecified multiple injuries, initial encounter: Secondary | ICD-10-CM

## 2012-11-25 DIAGNOSIS — E119 Type 2 diabetes mellitus without complications: Secondary | ICD-10-CM

## 2012-11-25 LAB — GLUCOSE, CAPILLARY
Glucose-Capillary: 194 mg/dL — ABNORMAL HIGH (ref 70–99)
Glucose-Capillary: 201 mg/dL — ABNORMAL HIGH (ref 70–99)
Glucose-Capillary: 215 mg/dL — ABNORMAL HIGH (ref 70–99)

## 2012-11-25 NOTE — Progress Notes (Addendum)
Pt continues to doze off, attempt to get out of bed multiple times with no socks on and not call for assist, Rn continues to educate pt

## 2012-11-25 NOTE — Progress Notes (Signed)
Pt refused therapy this am, pt demanding to go outside and smoke, RN informed and educated pt she is unable to go outside to smoke, RN offered to call MD to get an order to take pt outside for fresh air, pt stated" I am leaving this place", RN than explained AMA policy to pt, MD notified

## 2012-11-25 NOTE — Progress Notes (Signed)
Occupational Therapy Session Note  Patient Details  Name: Veronica Thomas MRN: [id number] Date of Birth: [birth year]  Today's Date: 11/25/2012 Time:  -   1st session:  Pt refused 1st session due to fatigue                 2nd session:  1330-1430  (60 min)    Short Term Goals: Week 1:  OT Short Term Goal 1 (Week 1): Pt would maintain her c- collar on for a 24 hr period with mod cuing OT Short Term Goal 2 (Week 1): Pt would pick out clothing with extra time and min cuing OT Short Term Goal 3 (Week 1): Pt would sequence bathing and dressing tasks with min A OT Short Term Goal 4 (Week 1): Pt would perform grooming at sink with min A  Skilled Therapeutic Interventions/Progress Updates:    2nd session:  Pt. Sitting in recliner upon OT arrival.  Addressed functional mobility, UE AROM on right, standing balance.  Pt. Ambulated over to sink with close supervision.  Sat in wc and did shower cap to head.  Had ppt massage head with RUE for strengthening, AROM.  Pt. Able to maintain for 2 minutes before taking rest break.  Performed this throughout the session.  Changed out pads on cervical collar.  Stood and walked back to bed.  Placed bed alarm on at end of session.    Therapy Documentation Precautions:  Precautions Precautions: Fall;Cervical Precaution Comments: Reviewed cervical precautions with patient and discussed need for cervical brace to keep neck/head still. Required Braces or Orthoses: Cervical Brace Cervical Brace: Hard collar Restrictions Weight Bearing Restrictions: Yes LUE Weight Bearing: Non weight bearing Other Position/Activity Restrictions: sling for comfort       Pain: Pain Assessment Pain Assessment: 0-10 Pain Score:   8 Pain Type: Acute pain Pain Location: Neck Pain Descriptors / Indicators: Aching Pain Frequency: Constant Pain Onset: Gradual Patients Stated Pain Goal: 2 Pain Intervention(s): Medication (See eMAR)    See FIM for current functional  status  Therapy/Group: Individual Therapy  Humberto Seals 11/25/2012, 11:01 AM

## 2012-11-25 NOTE — Progress Notes (Signed)
Orthopedic Tech Progress Note Patient Details:  Veronica Thomas [birth year] [id number] [employer] called to place order Patient ID: [name], female   DOB: [birth year], 37 y.o.   MRN: [id number]   Orie Rout 11/25/2012, 10:39 AM
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
## 2012-11-25 NOTE — Progress Notes (Signed)
Speech Language Pathology Daily Session Note  Patient Details  Name: Veronica Thomas MRN: [id number] Date of Birth: 04/06/1976  Today's Date: 11/25/2012 Time: 4098-1191 Time Calculation (min): 35 min  Short Term Goals: Week 1: SLP Short Term Goal 1 (Week 1): Patient will perform mildly complex-complex functional problem solving tasks(financial/money management, medication management, etc) with 80% accuracy. SLP Short Term Goal 2 (Week 1): Patient will selectively attend in mildly distracting environment with minimal cues to redirect. SLP Short Term Goal 3 (Week 1): Patient will initiate and maintain conversation for 4-5 turns with minimal cues. SLP Short Term Goal 4 (Week 1): Patient will demonstrate emergent and anticipatory awareness by describing her functional deficits, and the potential impact these deficits will have on her ability to complete ADL's, with 80% accuracy.  Skilled Therapeutic Interventions: Therapeutic intervention complete with short term goals addressed.  Patient  was 80% accurate with basic money management activity.  She was able to initiate and maintain conversation with min A cues for about 10 minutes.  She required maximal assistance via verbal cues to demonstrate emergent awareness regarding her deficits.  She was easily agitated when deficits were brought to her attention.  Continue with current treatment plan   FIM:  Comprehension Comprehension Mode: Auditory Expression Expression Mode: Verbal Social Interaction Social Interaction: 4-Interacts appropriately 75 - 89% of the time - Needs redirection for appropriate language or to initiate interaction. Problem Solving Problem Solving: 4-Solves basic 75 - 89% of the time/requires cueing 10 - 24% of the time  Pain Pain Assessment Pain Assessment: 0-10 Pain Score:   8 Pain Type: Acute pain Pain Location: Neck Pain Descriptors / Indicators: Aching Pain Onset: On-going Patients Stated Pain Goal: 2 Pain  Intervention(s): Medication (See eMAR)  Therapy/Group: Individual Therapy  Lenny Pastel 11/25/2012, 2:28 PM

## 2012-11-26 ENCOUNTER — Inpatient Hospital Stay (HOSPITAL_COMMUNITY): Payer: No Typology Code available for payment source | Admitting: Physical Therapy

## 2012-11-26 DIAGNOSIS — M7989 Other specified soft tissue disorders: Secondary | ICD-10-CM

## 2012-11-26 LAB — GLUCOSE, CAPILLARY
Glucose-Capillary: 143 mg/dL — ABNORMAL HIGH (ref 70–99)
Glucose-Capillary: 202 mg/dL — ABNORMAL HIGH (ref 70–99)
Glucose-Capillary: 160 mg/dL — ABNORMAL HIGH (ref 70–99)

## 2012-11-26 NOTE — Progress Notes (Signed)
At 1951 requesting 20mg  of PRN dose of Oxy IR. Rating pain at 10 on pain scale. Complaining mostly of right "rib" pain. Refused HS colace. PRN robaxin given at 2207. Patient with increased anxiety noted at 2259-PRN ativan given. Has rested quietly during the night without any other medication. Bedalarm applied to remind patient to call for assistance. Incision at OD oozing serous drainage. Patient reports taking klonipin bid at home.Veronica Thomas A

## 2012-11-26 NOTE — Progress Notes (Signed)
Physical Therapy Note  Patient Details  Name: Veronica Thomas MRN: [id number] Date of Birth: 01/23/1976 Today's Date: 11/26/2012  1440-1500 (20 minutes) individual (pt missed 25 minutes secondary to 10/10 pain neck/ premedicated Pain : See above Focus of treatment: Bilateral LE strengthening Treatment: Pt in bed upon arrival and stated she is unable to get out of bed secondary to pain as above but agreed to bilateral LE strengthening exercises X 20 in supine- ankle pumps, quad sets, glut sets, heel slides, hip abduction.    Ahnika Hannibal,JIM 11/26/2012, 3:06 PM
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
## 2012-11-26 NOTE — Progress Notes (Signed)
VASCULAR LAB PRELIMINARY  PRELIMINARY  PRELIMINARY  PRELIMINARY  Bilateral lower extremity venous duplex completed.    Preliminary report:  Bilateral:  No evidence of DVT, superficial thrombosis, or Baker's Cyst.   Omya Winfield, RVS 11/26/2012, 9:01 AM
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
## 2012-11-27 ENCOUNTER — Inpatient Hospital Stay (HOSPITAL_COMMUNITY): Payer: No Typology Code available for payment source | Admitting: Occupational Therapy

## 2012-11-27 ENCOUNTER — Encounter (HOSPITAL_COMMUNITY): Payer: Self-pay

## 2012-11-27 ENCOUNTER — Inpatient Hospital Stay (HOSPITAL_COMMUNITY): Payer: No Typology Code available for payment source | Admitting: Speech Pathology

## 2012-11-27 ENCOUNTER — Inpatient Hospital Stay (HOSPITAL_COMMUNITY): Payer: No Typology Code available for payment source | Admitting: *Deleted

## 2012-11-27 ENCOUNTER — Inpatient Hospital Stay (HOSPITAL_COMMUNITY): Payer: No Typology Code available for payment source

## 2012-11-27 LAB — GLUCOSE, CAPILLARY: Glucose-Capillary: 125 mg/dL — ABNORMAL HIGH (ref 70–99)

## 2012-11-27 MED ORDER — INSULIN GLARGINE 100 UNIT/ML ~~LOC~~ SOLN
30.0000 [IU] | Freq: Two times a day (BID) | SUBCUTANEOUS | Status: DC
  Administered 2012-11-27 – 2012-11-30 (×6): 30 [IU] via SUBCUTANEOUS
  Filled 2012-11-27 (×7): qty 0.3

## 2012-11-27 MED ORDER — FLUOXETINE HCL 20 MG PO CAPS
40.0000 mg | ORAL_CAPSULE | Freq: Every day | ORAL | Status: DC
  Administered 2012-11-27 – 2012-11-30 (×4): 40 mg via ORAL
  Filled 2012-11-27 (×6): qty 2

## 2012-11-27 NOTE — Progress Notes (Signed)
Physical Therapy Session Note  Patient Details  Name: Veronica Thomas MRN: [id number] Date of Birth: 1976/04/11  Today's Date: 11/27/2012 Time: 4132-4401 Time Calculation (min): 45 min  Short Term Goals: Week 1:  PT Short Term Goal 1 (Week 1): Pt will perform functional transfers with supervision PT Short Term Goal 2 (Week 1): Pt will gait in controlled environment 100' with supervision  Skilled Therapeutic Interventions/Progress Updates:     Patient received sitting in recliner. Session focused on gait training, dynamic balance, and strengthening; see details below.  Patient performed x20 heel raises without UE support and SBA. Patient returned to room and left seated in recliner with all needs within reach.  Therapy Documentation Precautions:  Precautions Precautions: Fall;Cervical Required Braces or Orthoses: Cervical Brace Cervical Brace: Hard collar Restrictions Weight Bearing Restrictions: Yes LUE Weight Bearing: Non weight bearing Other Position/Activity Restrictions: sling for comfort Pain: Pain Assessment Pain Assessment: No/denies pain Pain Score: 0-No pain Locomotion : Ambulation Ambulation: Yes Ambulation/Gait Assistance: 5: Supervision Ambulation Distance (Feet): 180 Feet x1, 175' x1 Assistive device: None Ambulation/Gait Assistance Details: Verbal cues for gait pattern Ambulation/Gait Assistance Details: Patient instructed in gait training 180' x1 in controlled environment without AD and with supervision. Patient requires verbal cuces to increase stride length. Patient instructed in gait training 175'x1 (75' on carpet) without AD and with supervision, no LOB. Gait Gait: Yes Gait Pattern: Decreased stride length;Wide base of support High Level Ambulation High Level Ambulation: Side stepping;Backwards walking;Other high level ambulation Side Stepping: x30' in each direction with supervision, no overt LOB Backwards Walking: x30' with supervision, no overt  LOB High Level Ambulation - Other Comments: Tandem walking x30' with min A-min guard, one LOB requiring min assist Stairs / Additional Locomotion Stairs: Yes Stairs Assistance: 5: Supervision Stairs Assistance Details: Verbal cues for precautions/safety Stairs Assistance Details (indicate cue type and reason): Verbal cues for appropriate pacing. Stair Management Technique: One rail Right;Alternating pattern;Forwards Number of Stairs: 5 Height of Stairs: 6 Wheelchair Mobility Wheelchair Mobility: No  Balance: Dynamic Standing Balance Dynamic Standing - Balance Support: No upper extremity supported Dynamic Standing - Level of Assistance: 5: Stand by assistance Dynamic Standing - Balance Activities: Lateral lean/weight shifting;Forward lean/weight shifting;Reaching for objects Dynamic Standing - Comments: Alternating toe taps (x20) on 6 inch step without UE support, SBA. Patient picked up 6 items of various sizes off the floor, requiring close supervision. Other Treatments: Treatments Therapeutic Activity: x10 sit<>stand without UE support and SBA; emphasis on slow, controlled descent during stand>sit to improve B eccentric control of quads.  See FIM for current functional status  Therapy/Group: Individual Therapy  Chipper Herb. Analeigha Nauman, PT, DPT  11/27/2012, 1:46 PM

## 2012-11-27 NOTE — Progress Notes (Signed)
Occupational Therapy Session Note  Patient Details  Name: Veronica Thomas MRN: [id number] Date of Birth: 09/29/1975  Today's Date: 11/27/2012 Time: 4098-1191 Time Calculation (min): 30 min  Short Term Goals: Week 1:  OT Short Term Goal 1 (Week 1): Pt would maintain her c- collar on for a 24 hr period with mod cuing OT Short Term Goal 2 (Week 1): Pt would pick out clothing with extra time and min cuing OT Short Term Goal 3 (Week 1): Pt would sequence bathing and dressing tasks with min A OT Short Term Goal 4 (Week 1): Pt would perform grooming at sink with min A  Skilled Therapeutic Interventions/Progress Updates:    Pt seen for 1:1 OT with focus on activity tolerance, standing balance, functional mobility, and sequencing/problem solving.  Engaged in "spot it" card activity with focus on sorting cards based on matching objects and quick response time as well as problem solving.  Pt ambulated back to room ~100 feet with min/steady assist.  Therapy Documentation Precautions:  Precautions Precautions: Fall;Cervical Precaution Comments: Reviewed cervical precautions with patient and discussed need for cervical brace to keep neck/head still. Required Braces or Orthoses: Cervical Brace Cervical Brace: Hard collar Restrictions Weight Bearing Restrictions: Yes LUE Weight Bearing: Non weight bearing Other Position/Activity Restrictions: sling for comfort Pain: Pain Assessment Pain Assessment: 0-10 Pain Score:   5 Pain Type: Acute pain Pain Location: Arm Pain Orientation: Left Pain Descriptors / Indicators: Aching Pain Frequency: Occasional Pain Onset: Gradual Patients Stated Pain Goal: 3 Pain Intervention(s): Medication (See eMAR) (ultram 100mg  po)  See FIM for current functional status  Therapy/Group: Individual Therapy  Leonette Monarch 11/27/2012, 3:09 PM

## 2012-11-27 NOTE — Progress Notes (Signed)
Reviewed and in agreement with treatment provided.  

## 2012-11-27 NOTE — Progress Notes (Signed)
Occupational Therapy Session Notes  Patient Details  Name: Veronica Thomas MRN: [id number] Date of Birth: Sep 26, 1975  Today's Date: 11/27/2012  Short Term Goals: Week 1:  OT Short Term Goal 1 (Week 1): Pt would maintain her c- collar on for a 24 hr period with mod cuing OT Short Term Goal 2 (Week 1): Pt would pick out clothing with extra time and min cuing OT Short Term Goal 3 (Week 1): Pt would sequence bathing and dressing tasks with min A OT Short Term Goal 4 (Week 1): Pt would perform grooming at sink with min A  Skilled Therapeutic Interventions/Progress Updates:  Session Note #1: Time: 0930-1030 (60 mins) Pt reported 10/10 pain in ribs. Pt stated she received pain meds this AM.  Upon entering room, pt supine in bed. Pt->EOB then ambulated->sink. Pt completed bathing/dressing task seated in chair @ sink with sit<>stand for peri care/donning of underwear. OT doffed LUE sling for bathing task then re-applied sling once finished. Pt then ambulated->bed. Once supine, OT re-adjusted cervical brace. Pt->EOB then ambulated->recliner. Pt tearful throughout tx session and needed encouragement for participation. Pt required min v.c's for general safety awareness during session (safety when standing). At end of tx session, pt seated in recliner with call bell within reach.  Session Note #2: Time: 1130-1200 (30 mins) Pt with no report of pain.  Skilled intervention focused on overall fxal endurance, fxal mobility, safety awareness (not walking over cords or through tight spaces that may increase risk of falling), navigating around obstacles, and dynamic standing balance. OT also focused on focused attention and problem solving during card game. OT pushed pt->room. Pt in better spirits this tx session. At end of tx session, pt seated in w/c with call bell within reach.  Therapy Documentation Precautions:  Precautions Precautions: Fall;Cervical Precaution Comments: Reviewed cervical precautions  with patient and discussed need for cervical brace to keep neck/head still. Required Braces or Orthoses: Cervical Brace Cervical Brace: Hard collar Restrictions Weight Bearing Restrictions: Yes LUE Weight Bearing: Non weight bearing Other Position/Activity Restrictions: sling for comfort  See FIM for current functional status  Therapy/Group: Individual Therapy  Hilary Pundt 11/27/2012, 7:20 AM

## 2012-11-27 NOTE — Progress Notes (Signed)
Social Work Patient ID: [name], female   DOB: 1976/02/20, 37 y.o.   MRN: [id number]  Have spoken with pt and both sisters today to further discuss d/c plans. Initially spoke with sister, Toy Baker, to better determine if she felt she could continue to provide care to both of pt's children after her d/c.  Team does not anticipate that pt will be able to assume care of 82 month old initially and could need childcare support for a few weeks.  Sister states that she is prepared to keep both children "as long as I need to".  Following this conversation, I spoke with patient and her other sister who is here to visit.  Explained to pt that team has concerns about her trying to provide care at home to her youngest child and that we recommend that she allow her family to continue to assist with her care after d/c.  Pt actually very agreeable.  Other sister, Ginger, also supportive of this plan and commits to helping pt and her family after d/c.  Pt admits she is emotionally distressed not being able to provide care, however, expresses a more realistic view than upon my initial interview with her.  Will continue to follow.  Malissa Slay, LCSW
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
## 2012-11-27 NOTE — Progress Notes (Signed)
Orthopedic Tech Progress Note Patient Details:  Veronica Thomas 10-18-1975 [id number]  Ortho Devices Type of Ortho Device: Short arm splint Ortho Device/Splint Interventions: Application   Cammer, Mickie Bail 11/27/2012, 9:10 AM
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
## 2012-11-27 NOTE — Progress Notes (Signed)
Notes reviewed and accurately reflect treatment sessions.   

## 2012-11-27 NOTE — Progress Notes (Signed)
Speech Language Pathology Daily Session Note  Patient Details  Name: Veronica Thomas MRN: [id number] Date of Birth: [birth year]  Today's Date: 11/27/2012 Time: 4098-1191 Time Calculation (min): 35 min  Short Term Goals: Week 1: SLP Short Term Goal 1 (Week 1): Patient will perform mildly complex-complex functional problem solving tasks(financial/money management, medication management, etc) with 80% accuracy. SLP Short Term Goal 2 (Week 1): Patient will selectively attend in mildly distracting environment with minimal cues to redirect. SLP Short Term Goal 3 (Week 1): Patient will initiate and maintain conversation for 4-5 turns with minimal cues. SLP Short Term Goal 4 (Week 1): Patient will demonstrate emergent and anticipatory awareness by describing her functional deficits, and the potential impact these deficits will have on her ability to complete ADL's, with 80% accuracy.  Skilled Therapeutic Interventions: Treatment focus on cognitive goals. SLP facilitated session by providing Max A question and semantic cues for anticipatory awareness for d/c planning in regards to taking care of her 66 month old child. Pt demonstrates emergent awareness into deficits and their impact on her function but is unable to problem solve at this time that she will need assistance at home to help take care of her child due to her inability to bear weight through her LUE. Continue with current plan of care.    FIM:  Comprehension Comprehension Mode: Auditory Comprehension: 5-Understands basic 90% of the time/requires cueing < 10% of the time Expression Expression Mode: Verbal Expression: 5-Expresses complex 90% of the time/cues < 10% of the time Social Interaction Social Interaction: 5-Interacts appropriately 90% of the time - Needs monitoring or encouragement for participation or interaction. Problem Solving Problem Solving: 4-Solves basic 75 - 89% of the time/requires cueing 10 - 24% of the  time Memory Memory: 5-Recognizes or recalls 90% of the time/requires cueing < 10% of the time  Pain Pain Assessment Pain Assessment: No/denies pain Pain Score:   9 Pain Type: Acute pain Pain Location: Arm Pain Orientation: Left Pain Descriptors / Indicators: Aching;Constant;Heaviness;Pounding Pain Frequency: Occasional Pain Onset: Gradual Patients Stated Pain Goal: 3 Pain Intervention(s): Medication (See eMAR) (oxycodone 20mg  po ultram 100mg  po opana 40mg  po)  Therapy/Group: Individual Therapy  Koran Seabrook 11/27/2012, 9:49 AM

## 2012-11-28 ENCOUNTER — Inpatient Hospital Stay (HOSPITAL_COMMUNITY): Payer: No Typology Code available for payment source | Admitting: Physical Therapy

## 2012-11-28 ENCOUNTER — Inpatient Hospital Stay (HOSPITAL_COMMUNITY): Payer: No Typology Code available for payment source | Admitting: Speech Pathology

## 2012-11-28 ENCOUNTER — Inpatient Hospital Stay (HOSPITAL_COMMUNITY): Payer: Self-pay | Admitting: Occupational Therapy

## 2012-11-28 ENCOUNTER — Encounter (HOSPITAL_COMMUNITY): Payer: Self-pay

## 2012-11-28 ENCOUNTER — Inpatient Hospital Stay (HOSPITAL_COMMUNITY): Payer: Self-pay | Admitting: *Deleted

## 2012-11-28 DIAGNOSIS — S069XAA Unspecified intracranial injury with loss of consciousness status unknown, initial encounter: Secondary | ICD-10-CM

## 2012-11-28 DIAGNOSIS — S069X9A Unspecified intracranial injury with loss of consciousness of unspecified duration, initial encounter: Secondary | ICD-10-CM

## 2012-11-28 LAB — GLUCOSE, CAPILLARY
Glucose-Capillary: 139 mg/dL — ABNORMAL HIGH (ref 70–99)
Glucose-Capillary: 207 mg/dL — ABNORMAL HIGH (ref 70–99)
Glucose-Capillary: 159 mg/dL — ABNORMAL HIGH (ref 70–99)

## 2012-11-28 LAB — RAPID URINE DRUG SCREEN, HOSP PERFORMED
Barbiturates: NOT DETECTED
Cocaine: NOT DETECTED

## 2012-11-28 NOTE — Progress Notes (Signed)
Occupational Therapy Note  Patient Details  Name: Veronica Thomas MRN: [id number] Date of Birth: 03/09/1976 Today's Date: 11/28/2012  Pt missed 45 mins skilled OT treatment session secondary to refusal due to c/o fatigue.  Pt in bed reporting "I've just given out" and is unwilling to participate in therapy.  Pt reports thinking she "overdid it in therapy" yesterday and is requesting to just start over fresh in the AM.  Discussed pt's goals regarding self-care tasks and transfers and the importance of participating in therapy sessions to prepare for d/c home. Pt continues to refuse participation in therapy at this time despite encouragement.  Pt reports she will participate better tomorrow.  RN notified.  Leonette Monarch 11/28/2012, 3:05 PM

## 2012-11-28 NOTE — Plan of Care (Signed)
Problem: RH SAFETY Goal: RH STG ADHERE TO SAFETY PRECAUTIONS W/ASSISTANCE/DEVICE STG Adhere to Safety Precautions With min Assistance/Device.  Outcome: Not Progressing Patient not following the safety plan at times. Tranfers and Ambulates herself in room. adm  Problem: RH PAIN MANAGEMENT Goal: RH STG PAIN MANAGED AT OR BELOW PT'S PAIN GOAL 3 or less on scale 0-10.  Outcome: Not Progressing Patient rate pain at 9.adm

## 2012-11-28 NOTE — Plan of Care (Signed)
Problem: RH BOWEL ELIMINATION Goal: RH STG MANAGE BOWEL W/MEDICATION W/ASSISTANCE STG Manage Bowel with Medication with Min Assistance.  Outcome: Not Progressing Patient refusing miralax and colace

## 2012-11-28 NOTE — Progress Notes (Signed)
Occupational Therapy Note  Patient Details  Name: Veronica Thomas MRN: [id number] Date of Birth: 07-12-1975 Today's Date: 11/28/2012  Pt missed 60 mins skilled OT services.  Pt seated in recliner and stated that she was still exhausted from prior days therapies.  Pt stated she was just too fatigued although she was feeling better than earlier in the AM.  Discussed with patient the importance of therapy to increase endurance and strength so she could go home.  Pt verbalized understanding but continued to state she couldn't do anything.  Pt committed to participating in PT at 1300.   Lavone Neri [facility] 11/28/2012, 11:24 AM

## 2012-11-28 NOTE — Progress Notes (Signed)
Physical Therapy Note  Patient Details  Name: Veronica Thomas MRN: [id number] Date of Birth: September 03, 1975 Today's Date: 11/28/2012  Patient missed 60 minutes of skilled physical therapy this AM secondary to refusal due to c/o fatigue. Initially received patient supine in bed, RN present administering medications. Patient reports she "thinks she overdid it yesterday". Discussion with patient about discharge plans and importance of therapy despite fatigue so that she may increase activity tolerance/endurance and return home and do everything she needs to be able to do. Patient is motivated and states that yesterday she was "trying to prove how well she was doing and that she is able to go home and overdid it."  Followed up with patient at 0905 (35 minutes later) to attempt to initiate participation in short session, but patient continues to refuse therapy with c/o fatigue and states "I know my body, I just need to rest." Will follow up as able.  Zella Richer Kamir Selover S. Dontavious Emily, PT, DPT 11/28/2012, 9:13 AM

## 2012-11-28 NOTE — Progress Notes (Signed)
Late entry. Patient reported to RN last pm she wanted to take her home med prozac from purse at change of shift. Patient educated on hospital policy no home meds were to be taken without MD order . Order received to start home med . Night shift LPN , Anissa reviewed with patient home medications and medication ,  prozac taken to pharmacy . Reviewed with patient this am not to take any other medications other than what is given by RN that is ordered . No other home meds should be taken. Patient refused to allow staff to check on any other medications in purse. MD aware . Patient has been alert and oriented throughout day but refusing therapies . Continue to reinforce to patient not to pick or touch scabbed areas to right eye and top of scalp due to bleeding noted and increase purulent  Drainage from right eye scabbing . Reviewed signs and symptoms of infection with patient . Patient verbalized understanding of infection prevention . Patient asleep in bed at this time . Continue with plan of care.             Veronica Thomas
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
## 2012-11-28 NOTE — Progress Notes (Signed)
Speech Language Pathology Daily Session Note  Patient Details  Name: Veronica Thomas MRN: [id number] Date of Birth: [birth year]  Today's Date: 11/28/2012 Time: 0930-1010 Time Calculation (min): 40 min  Short Term Goals: Week 1: SLP Short Term Goal 1 (Week 1): Patient will perform mildly complex-complex functional problem solving tasks(financial/money management, medication management, etc) with 80% accuracy. SLP Short Term Goal 2 (Week 1): Patient will selectively attend in mildly distracting environment with minimal cues to redirect. SLP Short Term Goal 3 (Week 1): Patient will initiate and maintain conversation for 4-5 turns with minimal cues. SLP Short Term Goal 4 (Week 1): Patient will demonstrate emergent and anticipatory awareness by describing her functional deficits, and the potential impact these deficits will have on her ability to complete ADL's, with 80% accuracy.  Skilled Therapeutic Interventions: Treatment focus on cognitive goals. SLP facilitated session by providing supervision question and verbal cues for new learning task with focus on working memory, organization and problem solving.  Pt independently recalled rules of game but required supervision question and verbal cues for organization and problem solving with mildly complex task. Pt also independently demonstrated  increased anticipatory awareness in regards to d/c planning and having assistance with ADL's and childcare.    FIM:  Comprehension Comprehension Mode: Auditory Comprehension: 5-Understands basic 90% of the time/requires cueing < 10% of the time Expression Expression Mode: Verbal Expression: 6-Expresses complex ideas: With extra time/assistive device Social Interaction Social Interaction: 6-Interacts appropriately with others with medication or extra time (anti-anxiety, antidepressant). Problem Solving Problem Solving: 5-Solves basic problems: With no assist Memory Memory: 5-Recognizes or recalls 90% of  the time/requires cueing < 10% of the time  Pain Reports Pain is "better" since medication   Therapy/Group: Individual Therapy  Onyinyechi Huante 11/28/2012, 10:23 AM

## 2012-11-28 NOTE — Consult Note (Signed)
NEUROBEHAVIORAL STATUS EXAM - CONFIDENTIAL [facility]   MEDICAL NECESSITY:  Ms. Moncerrat Burnstein was seen on the [facility] Unit for a neurobehavioral status exam owing to the patient's diagnosis of TBI, and to assist in treatment planning during admission.   According to medical records, Ms. Pettinato was admitted to the rehab unit owing to functional deficits secondary to TBI, C5 fracture, comminuted left distal humerus fracture, and right ribs fractures.    Ms. Knapper was initially observed during a session of physical therapy. During this process, she became dizzy and said that the room started spinning. These symptoms were fleeting, lasting only a few seconds. Afterwards, she was very anxious and tearful. The therapist and student she was working with did a great job of calming her down and encouraged her to perform deep breathing techniques. I additionally provided education about her symptoms and assisted in calming the patient. For the future, she was told to immediately express her concerns to the treatment staff when any concerning symptoms arise and ask him or her to explain why the symptoms are occurring. This would service to alleviate some of her fears as she has a tendency to catastrophize, which can be a barrier to therapy at times.   PROCEDURES: [2 units [id number] on 11/27/12] Diagnostic clinical interview  Review of available records Mental Status Exam  MENTAL STATUS: Ms. Schlachter was fully oriented to person, place, time and situation. Her thought content was appropriate. She was appropriately dressed for season and situation. Normal posture was noted. She was friendly and rapport was easily established. Her speech was as expected and she was able to express ideas effectively. She seemed to understand test directions readily. Her affect was appropriately modulated but she seemed anxious and fearful at times. Attention and motivation were good.  Optimal test taking conditions were maintained.  From an emotional standpoint, Ms. Marion reported suffering from a significant degree of anxiety and stress that she feels is improving. Suicidal/homicidal ideation, plan or intent was denied. No manic or hypomanic episodes were reported. The patient denied ever experiencing any auditory/visual hallucinations. No major behavioral or personality changes were endorsed.    Overall, Ms. Vosler seems to be adjusting fairly well to her hospital stay but her anxiety and fearfulness remain mild barriers to the therapeutic process. One way to alleviate this barrier is to continually educate the patient on her medical status and reassure her that she is in a safe environment. Also continue to encourage her to express her fears to the staff as she seems to ruminate on certain mild symptoms and then begin to fear they could indicate something much worse is happening.   In light of these findings, the following recommendations are provided.    RECOMMENDATIONS  Recommendations for treatment team:    Provided ego support and supportive psychotherapy.    Possibly complete more thorough neuropsychological screen before discharge.   DIAGNOSES: TBI Anxiety disorder, NOS Depressive disorder, NOS   Debbe Mounts, Psy.D.  Clinical Neuropsychologist

## 2012-11-28 NOTE — Progress Notes (Signed)
Physical Therapy Note  Patient Details  Name: Veronica Thomas MRN: [id number] Date of Birth: [birth year] Today's Date: 11/28/2012  Time: 1300-1340 40 minutes (pt refused last 20 minutes of session due to c/o fatigue)  1:1 No c/o pain, pt c/o fatigue but willing to participate with encouragement.  Gait throughout unit with supervision, no LOB.  Dynamic gait training with speed and direction changes and start/stop with supervision, pt able to self correct all LOB.  Stair negotiation with 1 handrail with supervision x 1 flight.  Curb step training multiple attempts with supervision.  Car transfer training to simulated large SUV requiring min A for step up into car.  Supervision for car transfer to sedan height car.  Discussed car options for patient and she states her sister drives a sedan that she can ride home in.   DONAWERTH,KAREN 11/28/2012, 1:56 PM

## 2012-11-29 ENCOUNTER — Inpatient Hospital Stay (HOSPITAL_COMMUNITY): Payer: No Typology Code available for payment source | Admitting: Speech Pathology

## 2012-11-29 ENCOUNTER — Inpatient Hospital Stay (HOSPITAL_COMMUNITY): Payer: No Typology Code available for payment source | Admitting: Occupational Therapy

## 2012-11-29 ENCOUNTER — Inpatient Hospital Stay (HOSPITAL_COMMUNITY): Payer: No Typology Code available for payment source | Admitting: Physical Therapy

## 2012-11-29 DIAGNOSIS — S069X9A Unspecified intracranial injury with loss of consciousness of unspecified duration, initial encounter: Secondary | ICD-10-CM

## 2012-11-29 LAB — RAPID URINE DRUG SCREEN, HOSP PERFORMED
Amphetamines: NOT DETECTED
Amphetamines: NOT DETECTED
Benzodiazepines: NOT DETECTED
Cocaine: NOT DETECTED
Opiates: NOT DETECTED
Opiates: NOT DETECTED
Tetrahydrocannabinol: NOT DETECTED

## 2012-11-29 LAB — GLUCOSE, CAPILLARY: Glucose-Capillary: 182 mg/dL — ABNORMAL HIGH (ref 70–99)

## 2012-11-29 MED ORDER — LIVING WELL WITH DIABETES BOOK
Freq: Once | Status: AC
  Administered 2012-11-29: 13:00:00
  Filled 2012-11-29: qty 1

## 2012-11-29 NOTE — Plan of Care (Signed)
Problem: RH Tub/Shower Transfers Goal: LTG Patient will perform tub/shower transfers w/assist (OT) LTG: Patient will perform tub/shower transfers with assist, with/without cues using equipment (OT)  Outcome: Not Applicable Date Met:  11/29/12 Pt plans to sponge bathe at sink initially secondary to fear of falling. Due to pt requesting to D/C early and not complete course of treatment, this goal has been d/c'ed.

## 2012-11-29 NOTE — Progress Notes (Signed)
6/25  Spoke with patient about her diabetes.  Sees Dr. Milas Gain for PCP outside hospital.  Was diagnosed with diabetes about 1 year ago. Has been on Metformin and Glipizide in the past. Has been on Victoza recently.  States that her blood sugars are always greater than 300 mg/dl at home.  Does have strips and needles for checking blood sugars.  States that she wants to follow the insulin regimen she has been on here in the hospital because her blood sugars are so much better.  Reviewed actions of Lantus and Novolog, hypoglycemia signs and sympotms, HgbA1C, range of normal blood sugars.  Ordered Living Well with Diabetes booklet, dietician to see patient according to staff nurse, staff RNs to have patient watch DM videos 506 and 508 on insulin, practice injecting insulin for nurse, and check patient on doing own CBGs.  Will continue to follow while in hospital.  Smith Mince RN BSN CDE

## 2012-11-29 NOTE — Progress Notes (Signed)
Occupational Therapy Session Note  Patient Details  Name: Veronica Thomas MRN: [id number] Date of Birth: [birth year]  Today's Date: 11/29/2012 Time: 1130-1200 Time Calculation (min): 30 min  Short Term Goals: Week 1:  OT Short Term Goal 1 (Week 1): Pt would maintain her c- collar on for a 24 hr period with mod cuing OT Short Term Goal 2 (Week 1): Pt would pick out clothing with extra time and min cuing OT Short Term Goal 3 (Week 1): Pt would sequence bathing and dressing tasks with min A OT Short Term Goal 4 (Week 1): Pt would perform grooming at sink with min A  Skilled Therapeutic Interventions/Progress Updates:  Patient found supine in bed with 8/10 pain in right rib area, patient stated she already had pain medication. Patient transferred out of bed at modified independent level and ambulated -> sink side for education on hemi techniques for bathing & dressing UB. Patient able to bathe UB with supervision and donn shirt with supervision. Patient requires assistance to donn sling for LUE. Patient then transferred back to bed for re-adjustment of cervical collar. Afterwards, patient engaged in functional mobility focusing on overall safety with functional mobility and topographical orientation. Patient required mod verbal cues for topographical orientation around 4000 unit. At end of session, left patient seated in recliner with call bell, phone, and lunch tray within reach.   Precautions:  Precautions Precautions: Fall;Cervical Precaution Comments: Reviewed cervical precautions with patient and discussed need for cervical brace to keep neck/head still. Required Braces or Orthoses: Cervical Brace Cervical Brace: Hard collar Restrictions Weight Bearing Restrictions: Yes LUE Weight Bearing: Non weight bearing Other Position/Activity Restrictions: sling for comfort  See FIM for current functional status  Therapy/Group: Individual Therapy  Everado Pillsbury 11/29/2012, 12:04 PM

## 2012-11-29 NOTE — Progress Notes (Signed)
Social Work Patient ID: Veronica Thomas, female   DOB: 06/12/1975, 37 y.o.   MRN: [id number]  Have reviewed team conference with patient - very pleased with target d/c tomorrow.  Discussed team recommendation that she have 24/7 supervision initially upon d/c.  Pt aware and agreeable with this and feels she can have someone there - no definite plan of who yet.  Will arrange [facility] services to include HHSW to monitor home situation.  Have also placed referral for PCS Aide.  Pt to contact her sisters to determine who could pick her up tomorrow.  Will continue to follow.  Breton Berns, LCSW
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
## 2012-11-29 NOTE — Progress Notes (Signed)
Speech Language Pathology Daily Session Notes  Patient Details  Name: Veronica Thomas MRN: [id number] Date of Birth: [birth year]  Today's Date: 11/29/2012  Session 1 Time: 0800-0830 Time Calculation (min): 30 min  Session 2 Time: 1100-1130 Time Calculation: 30 min  Short Term Goals: Week 1: SLP Short Term Goal 1 (Week 1): Patient will perform mildly complex-complex functional problem solving tasks(financial/money management, medication management, etc) with 80% accuracy. SLP Short Term Goal 2 (Week 1): Patient will selectively attend in mildly distracting environment with minimal cues to redirect. SLP Short Term Goal 3 (Week 1): Patient will initiate and maintain conversation for 4-5 turns with minimal cues. SLP Short Term Goal 4 (Week 1): Patient will demonstrate emergent and anticipatory awareness by describing her functional deficits, and the potential impact these deficits will have on her ability to complete ADL's, with 80% accuracy.  Skilled Therapeutic Interventions:  Session 1: Treatment focus on cognitive goals. Pt independently performed basic money management tasks and verbally described her "system" for paying bills/managing money at home. Pt also recalled the name, dosage and functions of all her current medications and organized them in a pill box with Mod I.  Although pt can effectively recall and organize her pills, recommend 24 hour supervision for medication management due to her current TBI. Pt also demonstrated increased insight in regards to current medications and verbalized, "My thinking is quicker on this new pain medicine."     Session 2: Treatment focus on cognitive goals. SLP facilitated session by providing handout in regards to memory compensatory strategies. Pt independently verbalized ways to incorporate strategies into her functional ADL's. Pt also independently recalled previous therapy session and demonstrated emergent awareness of difficulties with dressing due  to LUE.   FIM:  Comprehension Comprehension Mode: Auditory Comprehension: 6-Follows complex conversation/direction: With extra time/assistive device Expression Expression Mode: Verbal Expression: 6-Expresses complex ideas: With extra time/assistive device Social Interaction Social Interaction: 6-Interacts appropriately with others with medication or extra time (anti-anxiety, antidepressant). Problem Solving Problem Solving: 5-Solves complex 90% of the time/cues < 10% of the time Memory Memory: 6-More than reasonable amt of time  Pain Pain Assessment Pain Assessment: 0-10 Pain Score:   8 Pain Type: Acute pain Pain Location: Other (Comment) (neck, arm, chest) Pain Orientation: Right;Left Pain Descriptors / Indicators: Sore Pain Frequency: Intermittent Pain Onset: Gradual Patients Stated Pain Goal: 4 Pain Intervention(s): Medication (See eMAR) Multiple Pain Sites: No  Therapy/Group: Individual Therapy  Jessen Siegman 11/29/2012, 10:20 AM

## 2012-11-29 NOTE — Progress Notes (Signed)
Occupational Therapy Session Note  Patient Details  Name: Veronica Thomas MRN: [id number] Date of Birth: December 30, 1975  Today's Date: 11/29/2012 Time: 4098-1191 and 1305-1330 Time Calculation (min): 60 min and 25 min  Short Term Goals: Week 1:  OT Short Term Goal 1 (Week 1): Pt would maintain her c- collar on for a 24 hr period with mod cuing OT Short Term Goal 2 (Week 1): Pt would pick out clothing with extra time and min cuing OT Short Term Goal 3 (Week 1): Pt would sequence bathing and dressing tasks with min A OT Short Term Goal 4 (Week 1): Pt would perform grooming at sink with min A  Skilled Therapeutic Interventions/Progress Updates:    1) Pt seen for ADL retraining with focus on sequencing with bathing and dressing tasks.  Pt seated in recliner with RN present providing meds.  Engaged in bathing at sit to stand level at sink, OT doffed LUE sling for bathing tasks with pt verbalizing non-weightbearing status.  Pt verbalized dressing technique to compensate for decreased use of LUE, however continued to require physical assistance with donning shirt over LUE.  Discussed use of tank top in lieu of bra as pt is unable to don bra at this time secondary to inability to use LUE functionally and decreased ROM.  Pt tearful post UB dressing, reporting desire to be independent and overwhelmed by LUE sling and cervical brace.  Pt reports plan is for sister to come to house to assist with bathing and dressing.  Pt very motivated to return home as independent as possible.  2) Pt in bed upon arrival, reporting pain in ribs from prior therapy sessions and unwilling to get OOB.  Discussed d/c plans and recall of education on bathing and dressing techniques to compensate for decreased use of LUE.  Pt verbalized UB bathing and dressing technique and discussed plan to bathe at sink level due to fear of falling at shower level.  Discussed tub bench and showed pt picture and discussed transfer technique, however plan  to have HHOT practice transfer with pt in home.  Pt reports concern regarding when sister will be able to pick her up for d/c and reassured that she could call again with SLP or SWK.  Pt adamant to stay in bed at this time and reports she has "rehearsed" everything and feels comfortable with d/c home tomorrow.    Pt missed 35 mins due to pain and refusal to participate in OOB treatment.  Therapy Documentation Precautions:  Precautions Precautions: Fall;Cervical Precaution Comments: Reviewed cervical precautions with patient and discussed need for cervical brace to keep neck/head still. Required Braces or Orthoses: Cervical Brace Cervical Brace: Hard collar Restrictions Weight Bearing Restrictions: Yes LUE Weight Bearing: Non weight bearing Other Position/Activity Restrictions: sling for comfort Pain: Pain Assessment Pain Assessment: 0-10 Pain Score:   8 Pain Type: Acute pain Pain Location: Other (Comment) (neck, arm, chest) Pain Orientation: Right;Left Pain Descriptors / Indicators: Sore Pain Frequency: Intermittent Pain Onset: Gradual Patients Stated Pain Goal: 4 Pain Intervention(s): Medication (See eMAR) Multiple Pain Sites: No  See FIM for current functional status  Therapy/Group: Individual Therapy  Leonette Monarch 11/29/2012, 10:58 AM

## 2012-11-29 NOTE — Progress Notes (Signed)
Physical Therapy Discharge Summary  Patient Details  Name: Veronica Thomas MRN: [id number] Date of Birth: September 30, 1975  Today's Date: 11/29/2012 Time: 1000-1045 45 minutes  PT treatment Gait training with mod I throughout unit and outdoors on a variety of surfaces, incline/decline, stairs and curb.  Pt with no LOB.  Pt able to safely navigate community environment mod I.  Berg balance test performed, pt improved to 53/56, low fall risk.  Pt educated on recommendation for supervision at home for medication management, pt reluctant to agree, stating "my sisters work so I will be alone".  Pt educated on importance of safety at home to reduce risk of falls or accidents.  PT discharge: Patient has met 8 of 8 long term goals due to improved activity tolerance, improved balance, improved postural control, ability to compensate for deficits and improved attention.  Patient to discharge at an ambulatory level Modified Independent. Recommendation for pt to have supervision at home due to poor anticipatory awareness and poor medication management.  Reasons goals not met: n/a  Recommendation: Pt with no further PT needs at this time.  Equipment: No equipment provided  Reasons for discharge: treatment goals met and discharge from hospital  Patient/family agrees with progress made and goals achieved: Yes  PT Discharge  Cognition Overall Cognitive Status: Impaired/Different from baseline Orientation Level: Oriented X4 Selective Attention: Appears intact Awareness: Impaired Awareness Impairment: Anticipatory impairment Sensation Sensation Light Touch: Appears Intact Proprioception: Appears Intact Coordination Gross Motor Movements are Fluid and Coordinated: Yes Fine Motor Movements are Fluid and Coordinated: Yes Motor  Motor Motor: Within Functional Limits   Trunk/Postural Assessment  Cervical Assessment Cervical Assessment:  (collar) Thoracic Assessment Thoracic Assessment: Within  Functional Limits Lumbar Assessment Lumbar Assessment:  (limited by pain) Postural Control Postural Control: Within Functional Limits  Balance Berg Balance Test Sit to Stand: Able to stand without using hands and stabilize independently Standing Unsupported: Able to stand safely 2 minutes Sitting with Back Unsupported but Feet Supported on Floor or Stool: Able to sit safely and securely 2 minutes Stand to Sit: Sits safely with minimal use of hands Transfers: Able to transfer safely, minor use of hands Standing Unsupported with Eyes Closed: Able to stand 10 seconds safely Standing Ubsupported with Feet Together: Able to place feet together independently and stand 1 minute safely From Standing, Reach Forward with Outstretched Arm: Can reach forward >12 cm safely (5") From Standing Position, Pick up Object from Floor: Unable to pick up shoe, but reaches 2-5 cm (1-2") from shoe and balances independently From Standing Position, Turn to Look Behind Over each Shoulder: Looks behind from both sides and weight shifts well Turn 360 Degrees: Able to turn 360 degrees safely in 4 seconds or less Standing Unsupported, Alternately Place Feet on Step/Stool: Able to stand independently and safely and complete 8 steps in 20 seconds Standing Unsupported, One Foot in Front: Able to place foot tandem independently and hold 30 seconds Standing on One Leg: Able to lift leg independently and hold > 10 seconds Total Score: 53 Extremity Assessment      RLE Assessment RLE Assessment: Within Functional Limits LLE Assessment LLE Assessment: Within Functional Limits  See FIM for current functional status  Tanganika Barradas 11/29/2012, 10:31 AM
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
## 2012-11-29 NOTE — Discharge Summary (Signed)
  Discharge summary job # [id number]

## 2012-11-29 NOTE — Plan of Care (Signed)
Problem: RH Dressing Goal: LTG Patient will perform upper body dressing (OT) LTG Patient will perform upper body dressing with assist, with/without cues (OT).  Downgraded due to pt choosing not to complete full recommended course of therapy.  With a couple more days pt should have reached supervision level.
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
## 2012-11-29 NOTE — Progress Notes (Signed)
Nutrition Education Note  RD consulted for nutrition education regarding diabetes.   Lab Results  Component Value Date   HGBA1C 6.5* 11/18/2012    RD provided "Carbohydrate Counting for People with Diabetes" handout from the Academy of Nutrition and Dietetics. Discussed different food groups and their effects on blood sugar, emphasizing carbohydrate-containing foods. Provided list of carbohydrates and recommended serving sizes of common foods.  Discussed importance of controlled and consistent carbohydrate intake throughout the day. Provided examples of ways to balance meals/snacks and encouraged intake of high-fiber, whole grain complex carbohydrates. Teach back method used.  Expect poor compliance.  Body mass index is 36.36 kg/(m^2). Pt meets criteria for Obese Class I based on current BMI.  Current diet order is Carbohydrate Modified Medium, patient is consuming approximately 100% of meals at this time. Labs and medications reviewed. No further nutrition interventions warranted at this time. RD contact information provided. If additional nutrition issues arise, please re-consult RD.  Jarold Motto MS, RD, LDN Pager: [phone] After-hours pager: [phone]

## 2012-11-29 NOTE — Progress Notes (Signed)
Speech Language Pathology Daily Session Note  Patient Details  Name: Veronica Thomas MRN: [id number] Date of Birth: 12-18-1975  Today's Date: 11/29/2012 Time: 4098-1191 Time Calculation (min): 25 min  Short Term Goals: Week 1: SLP Short Term Goal 1 (Week 1): Patient will perform mildly complex-complex functional problem solving tasks(financial/money management, medication management, etc) with 80% accuracy. SLP Short Term Goal 2 (Week 1): Patient will selectively attend in mildly distracting environment with minimal cues to redirect. SLP Short Term Goal 3 (Week 1): Patient will initiate and maintain conversation for 4-5 turns with minimal cues. SLP Short Term Goal 4 (Week 1): Patient will demonstrate emergent and anticipatory awareness by describing her functional deficits, and the potential impact these deficits will have on her ability to complete ADL's, with 80% accuracy.  Skilled Therapeutic Interventions: Skilled treatment session focused on cognitive goals. SLP facilitated discussion regarding discharge and anticipation of needs with increased wait time and question cue x1.  Patient verbally recalled information from earlier therapy sessions with increased wait time.     FIM:  Comprehension Comprehension Mode: Auditory Comprehension: 6-Follows complex conversation/direction: With extra time/assistive device Expression Expression Mode: Verbal Expression: 6-Expresses complex ideas: With extra time/assistive device Social Interaction Social Interaction: 6-Interacts appropriately with others with medication or extra time (anti-anxiety, antidepressant). Problem Solving Problem Solving: 5-Solves complex 90% of the time/cues < 10% of the time Memory Memory: 6-More than reasonable amt of time FIM - Eating Eating Activity: 7: Complete independence:no helper  Pain Pain Assessment Pain Assessment: Yes Pain Score:   6 Pain Type: Acute pain Pain Location: Other (Comment) (neck, arm,  chest) Pain Descriptors / Indicators: Spasm Pain Frequency: Constant Pain Onset: Progressive Patients Stated Pain Goal: 4 Pain Intervention(s): Medication (See eMAR) Multiple Pain Sites: No  Therapy/Group: Individual Therapy  Charlane Ferretti., CCC-SLP [phone]  Cassy Sprowl 11/29/2012, 4:55 PM
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
## 2012-11-29 NOTE — Progress Notes (Signed)
Occupational Therapy Discharge Summary  Patient Details  Name: Veronica Thomas MRN: [id number] Date of Birth: [birth year]  Today's Date: 11/29/2012  Patient has met 11 of 11 long term goals due to improved activity tolerance, improved balance, ability to compensate for deficits, improved attention and improved awareness.  Patient to discharge at overall Supervision level with self-care tasks and modified independent with toileting and toilet transfers. Recommend pt have supervision at home with self-care tasks of bathing and dressing due to poor anticipatory awareness and poor medication management. Pt's sister plan to provide intermittent supervision.  Reasons goals not met: N/A  Recommendation:  Patient will benefit from ongoing skilled OT services in home health setting to continue to advance functional skills in the area of BADL, iADL and Reduce care partner burden.  Equipment: tub bench  Reasons for discharge: treatment goals met and discharge from hospital  Patient/family agrees with progress made and goals achieved: Yes  OT Discharge Precautions/Restrictions  Precautions Precautions: Fall;Cervical Required Braces or Orthoses: Cervical Brace Cervical Brace: Hard collar Restrictions Weight Bearing Restrictions: Yes LUE Weight Bearing: Non weight bearing Other Position/Activity Restrictions: sling for comfort General Amount of Missed OT Time (min): 35 Minutes Pain Pain Assessment Pain Assessment: 0-10 Pain Score:   4 Pain Type: Acute pain Pain Location: Other (Comment) (neck, arm, chest) Pain Orientation: Right;Left Pain Descriptors / Indicators: Sore Pain Frequency: Constant Pain Onset: Progressive Patients Stated Pain Goal: 4 Pain Intervention(s): Medication (See eMAR) Multiple Pain Sites: No ADL ADL Grooming: Modified independent Where Assessed-Grooming: Standing at sink Upper Body Bathing: Supervision/safety;Setup Where Assessed-Upper Body Bathing: Standing at  sink;Sitting at sink Lower Body Bathing: Supervision/safety;Setup Where Assessed-Lower Body Bathing: Sitting at sink;Standing at sink Upper Body Dressing: Supervision/safety;Setup Where Assessed-Upper Body Dressing: Sitting at sink Lower Body Dressing: Supervision/safety;Setup Where Assessed-Lower Body Dressing: Sitting at sink;Standing at sink Toileting: Modified independent Where Assessed-Toileting: Neurosurgeon Method: Ambulating Vision/Perception  Vision - History Patient Visual Report: No change from baseline Perception Perception: Within Functional Limits Praxis Praxis: Intact  Cognition Overall Cognitive Status: Impaired/Different from baseline Orientation Level: Oriented X4 Selective Attention: Appears intact Awareness: Impaired Awareness Impairment: Anticipatory impairment Sensation Sensation Light Touch: Appears Intact Proprioception: Appears Intact Coordination Gross Motor Movements are Fluid and Coordinated: Yes Fine Motor Movements are Fluid and Coordinated: Yes Motor  Motor Motor: Within Functional Limits Mobility     Trunk/Postural Assessment  Cervical Assessment Cervical Assessment:  (collar) Thoracic Assessment Thoracic Assessment: Within Functional Limits Lumbar Assessment Lumbar Assessment:  (limited by pain) Postural Control Postural Control: Within Functional Limits  Balance Berg Balance Test Sit to Stand: Able to stand without using hands and stabilize independently Standing Unsupported: Able to stand safely 2 minutes Sitting with Back Unsupported but Feet Supported on Floor or Stool: Able to sit safely and securely 2 minutes Stand to Sit: Sits safely with minimal use of hands Transfers: Able to transfer safely, minor use of hands Standing Unsupported with Eyes Closed: Able to stand 10 seconds safely Standing Ubsupported with Feet Together: Able to place feet together independently and stand 1  minute safely From Standing, Reach Forward with Outstretched Arm: Can reach forward >12 cm safely (5") From Standing Position, Pick up Object from Floor: Unable to pick up shoe, but reaches 2-5 cm (1-2") from shoe and balances independently From Standing Position, Turn to Look Behind Over each Shoulder: Looks behind from both sides and weight shifts well Turn 360 Degrees: Able to turn 360 degrees safely in 4 seconds or  less Standing Unsupported, Alternately Place Feet on Step/Stool: Able to stand independently and safely and complete 8 steps in 20 seconds Standing Unsupported, One Foot in Front: Able to place foot tandem independently and hold 30 seconds Standing on One Leg: Able to lift leg independently and hold > 10 seconds Total Score: 53 Extremity/Trunk Assessment RUE Assessment RUE Assessment: Within Functional Limits LUE Assessment LUE Assessment:  (distal WFL - arm restricted movement due to fx)  See FIM for current functional status  Leonette Monarch 11/29/2012, 1:52 PM

## 2012-11-30 LAB — CREATININE, SERUM
Creatinine, Ser: 0.55 mg/dL (ref 0.50–1.10)
GFR calc non Af Amer: >90 mL/min (ref >90)

## 2012-11-30 MED ORDER — NICOTINE 14 MG/24HR TD PT24: MEDICATED_PATCH | TRANSDERMAL | Status: AC

## 2012-11-30 MED ORDER — OXYCODONE HCL 10 MG PO TABS: 10.0000 mg | ORAL_TABLET | ORAL | Status: DC | PRN

## 2012-11-30 MED ORDER — LORAZEPAM 1 MG PO TABS: 1.0000 mg | ORAL_TABLET | ORAL | Status: DC | PRN

## 2012-11-30 MED ORDER — OXYMORPHONE HCL ER 40 MG PO T12A: 40.0000 mg | EXTENDED_RELEASE_TABLET | Freq: Two times a day (BID) | ORAL | Status: DC

## 2012-11-30 MED ORDER — NAPROXEN 500 MG PO TABS: 500.0000 mg | ORAL_TABLET | Freq: Two times a day (BID) | ORAL | Status: AC

## 2012-11-30 MED ORDER — TRAMADOL HCL 50 MG PO TABS: 100.0000 mg | ORAL_TABLET | Freq: Three times a day (TID) | ORAL | Status: DC

## 2012-11-30 MED ORDER — INSULIN GLARGINE 100 UNIT/ML ~~LOC~~ SOLN: 30.0000 [IU] | Freq: Two times a day (BID) | SUBCUTANEOUS | Status: AC

## 2012-11-30 MED ORDER — METHOCARBAMOL 500 MG PO TABS: 500.0000 mg | ORAL_TABLET | Freq: Four times a day (QID) | ORAL | Status: DC | PRN
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
## 2012-11-30 NOTE — Progress Notes (Signed)
Social Work  Discharge Note  The overall goal for the admission was met for:   Discharge location: Yes - home with family to provide 24/7 to intermittent supervision (23 mo old daughter to continue staying with patient's sister)  Length of Stay: Yes - 7 days  Discharge activity level: Yes - supervision to modified independent  Home/community participation: Yes  Services provided included: MD, RD, PT, OT, SLP, RN, TR, Pharmacy, Neuropsych and SW  Financial Services: Medicare and Medicaid  Follow-up services arranged: Home Health: RN, OT, ST, SW via [facility], DME: tub bench via [facility]  and Patient/Family has no preference for HH/DME agencies  Comments (or additional information): referral also placed for PCS Aide  Patient/Family verbalized understanding of follow-up arrangements: Yes  Individual responsible for coordination of the follow-up plan: patient  Confirmed correct DME delivered: Hall Birchard 11/30/2012    Tatiyana Foucher

## 2012-11-30 NOTE — Progress Notes (Signed)
Speech Language Pathology Discharge Summary  Patient Details  Name: Veronica Thomas MRN: [id number] Date of Birth: 06/21/1975  Today's Date: 11/30/2012  Patient has met 5 of 5 long term goals.  Patient to discharge at overall Supervision;Modified Independent level.   Reasons goals not met: N/A   Clinical Impression/Discharge Summary: Pt has made great progress and has met 5 of 5 LTG's this admission. Currently, pt is demonstrating behaviors consistent with a Rancho Level VIII and is overall supervision-Mod I for divided attention, anticipatory awareness, safety awareness, working memory and complex problem solving.  Recommend 24 hour supervision for appropriate management of medications, however, pt's family can provide intermittent at this time. Pt would benefit from f/u skilled SLP services to maximize cognitive recovery and overall functional independence.   Care Partner:  Caregiver Able to Provide Assistance: No (Recommend 24 hour supervision and can provide intermittent )  Type of Caregiver Assistance: Cognitive;Physical  Recommendation:  24 hour supervision/assistance;Home Health SLP  Rationale for SLP Follow Up: Maximize cognitive function and independence   Equipment: N/A   Reasons for discharge: Treatment goals met;Discharged from hospital   Patient/Family Agrees with Progress Made and Goals Achieved: Yes   See FIM for current functional status  Mikalah Skyles 11/30/2012, 7:41 AM

## 2012-12-06 ENCOUNTER — Telehealth: Payer: Self-pay

## 2012-12-06 NOTE — Telephone Encounter (Signed)
Someone with [facility] called with questions regarding patient.  Left message for them to call back.  They did not leave any details about message.

## 2012-12-13 NOTE — Progress Notes (Signed)
Physical Therapy Session Note  Patient Details  Name: Veronica Thomas MRN: [id number] Date of Birth: 08/31/1975  Today's Date: 11/25/2012 Time:1430-1505 35 min     Short Term Goals: Week 1:  PT Short Term Goal 1 (Week 1): Pt will perform functional transfers with supervision PT Short Term Goal 2 (Week 1): Pt will gait in controlled environment 100' with supervision  Skilled Therapeutic Interventions/Progress Updates:  Bed mobility: supine with HOB raised> <sitting, close supervision.  Gait training without AD x 100', min assist.  Pt aware of balance deficits but states she feels much more steady today vs yesterday.  Pt assisted back to bed, all needs within reach,  with supervision for bed mobility with head of bed raised due to c/o back pain when lying flat.   Therapy Documentation Precautions:  Precautions Precautions: Fall;Cervical Precaution Comments: Reviewed cervical precautions with patient and discussed need for cervical brace to keep neck/head still. Required Braces or Orthoses: Cervical Brace Cervical Brace: Hard collar Restrictions Weight Bearing Restrictions: Yes LUE Weight Bearing: Non weight bearing Other Position/Activity Restrictions: sling for comfort Pain: Pt c/o 9/10 pain in L UE and rib cage, RN aware, premedicated. Pt chose not to wear arm sling.   Pt insistent that she needs more pain meds; therapist discussed with RN.  See FIM for current functional status  Therapy/Group: Individual Therapy  Jaedynn Bohlken 12/13/2012, 5:07 PM

## 2012-12-14 ENCOUNTER — Telehealth: Payer: Self-pay

## 2012-12-14 ENCOUNTER — Telehealth: Payer: Self-pay | Admitting: *Deleted

## 2012-12-14 NOTE — Telephone Encounter (Signed)
Patient called again for oxycodone refill.  Advised her this has been referred to [facility].  Informed her she could contact PCP or urgent care since Dr Riley Kill is not available and she is not under any contract with our office.

## 2012-12-14 NOTE — Telephone Encounter (Signed)
Patient informed per Veronica Thomas verbal we will not refill her narcotics, she needs to follow up with pcp or other pain facility.  She is non-compliant.

## 2012-12-14 NOTE — Telephone Encounter (Signed)
Veronica Thomas has called for refill on her oxycodone.  She says she takes it 2 q 4 hours prn. She is out.  Also, the RN from [facility] has called and says that [name] is refusing ALL services.  She has not been seen in our office and Dr Riley Kill will not be back until Monday, therefore I am referring this to you Jesusita Oka.

## 2012-12-18 ENCOUNTER — Encounter (HOSPITAL_COMMUNITY): Payer: Self-pay | Admitting: *Deleted

## 2012-12-18 ENCOUNTER — Emergency Department (HOSPITAL_COMMUNITY)
Admission: EM | Admit: 2012-12-18 | Discharge: 2012-12-18 | Disposition: A | Discharge status: Home / Self Care | Payer: Medicare Other | Attending: Emergency Medicine | Admitting: Emergency Medicine

## 2012-12-18 ENCOUNTER — Emergency Department (HOSPITAL_COMMUNITY): Payer: Medicare Other

## 2012-12-18 DIAGNOSIS — Z8659 Personal history of other mental and behavioral disorders: Secondary | ICD-10-CM | POA: Insufficient documentation

## 2012-12-18 DIAGNOSIS — Y92009 Unspecified place in unspecified non-institutional (private) residence as the place of occurrence of the external cause: Secondary | ICD-10-CM | POA: Insufficient documentation

## 2012-12-18 DIAGNOSIS — M436 Torticollis: Secondary | ICD-10-CM | POA: Insufficient documentation

## 2012-12-18 DIAGNOSIS — R05 Cough: Secondary | ICD-10-CM | POA: Insufficient documentation

## 2012-12-18 DIAGNOSIS — Z8619 Personal history of other infectious and parasitic diseases: Secondary | ICD-10-CM | POA: Insufficient documentation

## 2012-12-18 DIAGNOSIS — M542 Cervicalgia: Secondary | ICD-10-CM | POA: Insufficient documentation

## 2012-12-18 DIAGNOSIS — R059 Cough, unspecified: Secondary | ICD-10-CM | POA: Insufficient documentation

## 2012-12-18 DIAGNOSIS — Z79899 Other long term (current) drug therapy: Secondary | ICD-10-CM | POA: Insufficient documentation

## 2012-12-18 DIAGNOSIS — R42 Dizziness and giddiness: Secondary | ICD-10-CM | POA: Insufficient documentation

## 2012-12-18 DIAGNOSIS — IMO0002 Reserved for concepts with insufficient information to code with codable children: Secondary | ICD-10-CM | POA: Insufficient documentation

## 2012-12-18 DIAGNOSIS — E119 Type 2 diabetes mellitus without complications: Secondary | ICD-10-CM | POA: Insufficient documentation

## 2012-12-18 DIAGNOSIS — S2241XA Multiple fractures of ribs, right side, initial encounter for closed fracture: Secondary | ICD-10-CM

## 2012-12-18 DIAGNOSIS — Z8669 Personal history of other diseases of the nervous system and sense organs: Secondary | ICD-10-CM | POA: Insufficient documentation

## 2012-12-18 DIAGNOSIS — S2249XA Multiple fractures of ribs, unspecified side, initial encounter for closed fracture: Secondary | ICD-10-CM | POA: Insufficient documentation

## 2012-12-18 DIAGNOSIS — S0990XA Unspecified injury of head, initial encounter: Secondary | ICD-10-CM | POA: Insufficient documentation

## 2012-12-18 DIAGNOSIS — Y9301 Activity, walking, marching and hiking: Secondary | ICD-10-CM | POA: Insufficient documentation

## 2012-12-18 DIAGNOSIS — F411 Generalized anxiety disorder: Secondary | ICD-10-CM | POA: Insufficient documentation

## 2012-12-18 DIAGNOSIS — F17200 Nicotine dependence, unspecified, uncomplicated: Secondary | ICD-10-CM | POA: Insufficient documentation

## 2012-12-18 DIAGNOSIS — Z8701 Personal history of pneumonia (recurrent): Secondary | ICD-10-CM | POA: Insufficient documentation

## 2012-12-18 DIAGNOSIS — W2203XA Walked into furniture, initial encounter: Secondary | ICD-10-CM | POA: Insufficient documentation

## 2012-12-18 DIAGNOSIS — Z794 Long term (current) use of insulin: Secondary | ICD-10-CM | POA: Insufficient documentation

## 2012-12-18 DIAGNOSIS — F329 Major depressive disorder, single episode, unspecified: Secondary | ICD-10-CM | POA: Insufficient documentation

## 2012-12-18 DIAGNOSIS — F3289 Other specified depressive episodes: Secondary | ICD-10-CM | POA: Insufficient documentation

## 2012-12-18 HISTORY — DX: Type 2 diabetes mellitus without complications: E11.9

## 2012-12-18 LAB — GLUCOSE, CAPILLARY: Glucose-Capillary: 145 mg/dL — ABNORMAL HIGH (ref 70–99)

## 2012-12-18 MED ORDER — OXYCODONE-ACETAMINOPHEN 5-325 MG PO TABS
ORAL_TABLET | ORAL | Status: DC
Start: 2012-12-18 — End: 2015-12-25

## 2012-12-18 MED ORDER — OXYCODONE-ACETAMINOPHEN 5-325 MG PO TABS
2.0000 | ORAL_TABLET | Freq: Once | ORAL | Status: AC
  Administered 2012-12-18: 2 via ORAL
  Filled 2012-12-18: qty 2

## 2012-12-18 MED ORDER — LORAZEPAM 1 MG PO TABS
1.0000 mg | ORAL_TABLET | Freq: Once | ORAL | Status: AC
  Administered 2012-12-18: 1 mg via ORAL
  Filled 2012-12-18: qty 1

## 2012-12-18 NOTE — ED Notes (Signed)
PT SISTER HAS ARRIVED TO PICK HER UP FOR HER APPOINTMENT
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
## 2012-12-18 NOTE — Progress Notes (Signed)
Met patient at bedside for discharge planning.Patient has a PCP and resides in [place]. Patient reports to having long term pain Issues.Patient reports in the past she has used  Pain clinics near home but wants a pain clinic in [place].Patient has an appointment today at the neurosurgery-spine clinic at  [address]. I contacted the clinic to see if they  Have a pain clinic-spoke with Lupita Leash in office who explained information will be  Provided at appointment.Spoke with patients sister Ginger- who is  Tearful re patients drug use.Contacted  CSW -[phone]- she will call Ginger. Patients sister updated.

## 2012-12-18 NOTE — ED Notes (Addendum)
Patient has arrived on pod c to be held until her doctors appointment this afternoon. She is somewhat agitated on arrival. She states she cannot get anyone to address her pain. States none of her doctors will refill her pain meds. Will speak to case management to assist.

## 2012-12-18 NOTE — ED Notes (Signed)
CASE MANAGER IN TO SEE PT 
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
## 2012-12-19 NOTE — ED Provider Notes (Signed)
Medical screening examination/treatment/procedure(s) were performed by non-physician practitioner and as supervising physician I was immediately available for consultation/collaboration.   Ashby Dawes, MD 12/19/12 1340

## 2013-06-18 DIAGNOSIS — K649 Unspecified hemorrhoids: Secondary | ICD-10-CM | POA: Diagnosis not present

## 2013-06-18 DIAGNOSIS — G8929 Other chronic pain: Secondary | ICD-10-CM | POA: Diagnosis not present

## 2013-06-18 DIAGNOSIS — E119 Type 2 diabetes mellitus without complications: Secondary | ICD-10-CM | POA: Diagnosis not present

## 2013-06-18 DIAGNOSIS — K6289 Other specified diseases of anus and rectum: Secondary | ICD-10-CM | POA: Diagnosis not present

## 2013-06-23 DIAGNOSIS — L299 Pruritus, unspecified: Secondary | ICD-10-CM | POA: Diagnosis not present

## 2013-06-23 DIAGNOSIS — E119 Type 2 diabetes mellitus without complications: Secondary | ICD-10-CM | POA: Diagnosis not present

## 2013-06-23 DIAGNOSIS — Z794 Long term (current) use of insulin: Secondary | ICD-10-CM | POA: Diagnosis not present

## 2013-06-23 DIAGNOSIS — R509 Fever, unspecified: Secondary | ICD-10-CM | POA: Diagnosis not present

## 2013-06-24 DIAGNOSIS — F329 Major depressive disorder, single episode, unspecified: Secondary | ICD-10-CM | POA: Diagnosis not present

## 2013-06-24 DIAGNOSIS — E1169 Type 2 diabetes mellitus with other specified complication: Secondary | ICD-10-CM | POA: Diagnosis not present

## 2013-06-24 DIAGNOSIS — R197 Diarrhea, unspecified: Secondary | ICD-10-CM | POA: Diagnosis not present

## 2013-06-24 DIAGNOSIS — R509 Fever, unspecified: Secondary | ICD-10-CM | POA: Diagnosis not present

## 2013-06-24 DIAGNOSIS — Z794 Long term (current) use of insulin: Secondary | ICD-10-CM | POA: Diagnosis not present

## 2013-06-24 DIAGNOSIS — L299 Pruritus, unspecified: Secondary | ICD-10-CM | POA: Diagnosis not present

## 2013-06-24 DIAGNOSIS — F3289 Other specified depressive episodes: Secondary | ICD-10-CM | POA: Diagnosis not present

## 2013-06-24 DIAGNOSIS — R21 Rash and other nonspecific skin eruption: Secondary | ICD-10-CM | POA: Diagnosis not present

## 2013-06-24 DIAGNOSIS — R110 Nausea: Secondary | ICD-10-CM | POA: Diagnosis not present

## 2013-06-24 DIAGNOSIS — Z79899 Other long term (current) drug therapy: Secondary | ICD-10-CM | POA: Diagnosis not present

## 2013-07-02 DIAGNOSIS — E119 Type 2 diabetes mellitus without complications: Secondary | ICD-10-CM | POA: Diagnosis not present

## 2013-07-02 DIAGNOSIS — Z79899 Other long term (current) drug therapy: Secondary | ICD-10-CM | POA: Diagnosis not present

## 2013-07-11 DIAGNOSIS — L989 Disorder of the skin and subcutaneous tissue, unspecified: Secondary | ICD-10-CM | POA: Diagnosis not present

## 2013-07-11 DIAGNOSIS — R197 Diarrhea, unspecified: Secondary | ICD-10-CM | POA: Diagnosis not present

## 2013-07-16 DIAGNOSIS — R197 Diarrhea, unspecified: Secondary | ICD-10-CM | POA: Diagnosis not present

## 2013-07-16 DIAGNOSIS — R159 Full incontinence of feces: Secondary | ICD-10-CM | POA: Diagnosis not present

## 2013-07-16 DIAGNOSIS — R609 Edema, unspecified: Secondary | ICD-10-CM | POA: Diagnosis not present

## 2013-07-23 DIAGNOSIS — L280 Lichen simplex chronicus: Secondary | ICD-10-CM | POA: Diagnosis not present

## 2013-07-23 DIAGNOSIS — L981 Factitial dermatitis: Secondary | ICD-10-CM | POA: Diagnosis not present

## 2013-07-23 DIAGNOSIS — R197 Diarrhea, unspecified: Secondary | ICD-10-CM | POA: Diagnosis not present

## 2013-07-25 DIAGNOSIS — M961 Postlaminectomy syndrome, not elsewhere classified: Secondary | ICD-10-CM | POA: Diagnosis not present

## 2013-07-25 DIAGNOSIS — G894 Chronic pain syndrome: Secondary | ICD-10-CM | POA: Diagnosis not present

## 2013-07-25 DIAGNOSIS — M5137 Other intervertebral disc degeneration, lumbosacral region: Secondary | ICD-10-CM | POA: Diagnosis not present

## 2013-07-25 DIAGNOSIS — IMO0002 Reserved for concepts with insufficient information to code with codable children: Secondary | ICD-10-CM | POA: Diagnosis not present

## 2013-07-27 DIAGNOSIS — R413 Other amnesia: Secondary | ICD-10-CM | POA: Diagnosis not present

## 2013-07-27 DIAGNOSIS — R21 Rash and other nonspecific skin eruption: Secondary | ICD-10-CM | POA: Diagnosis not present

## 2013-07-27 DIAGNOSIS — B338 Other specified viral diseases: Secondary | ICD-10-CM | POA: Diagnosis not present

## 2013-08-01 DIAGNOSIS — R609 Edema, unspecified: Secondary | ICD-10-CM | POA: Diagnosis not present

## 2013-08-01 DIAGNOSIS — R5383 Other fatigue: Secondary | ICD-10-CM | POA: Diagnosis not present

## 2013-08-01 DIAGNOSIS — E119 Type 2 diabetes mellitus without complications: Secondary | ICD-10-CM | POA: Diagnosis not present

## 2013-08-01 DIAGNOSIS — E559 Vitamin D deficiency, unspecified: Secondary | ICD-10-CM | POA: Diagnosis not present

## 2013-08-01 DIAGNOSIS — R159 Full incontinence of feces: Secondary | ICD-10-CM | POA: Diagnosis not present

## 2013-08-01 DIAGNOSIS — R5381 Other malaise: Secondary | ICD-10-CM | POA: Diagnosis not present

## 2013-08-01 DIAGNOSIS — R197 Diarrhea, unspecified: Secondary | ICD-10-CM | POA: Diagnosis not present

## 2013-08-06 DIAGNOSIS — G894 Chronic pain syndrome: Secondary | ICD-10-CM | POA: Diagnosis not present

## 2013-08-06 DIAGNOSIS — M5137 Other intervertebral disc degeneration, lumbosacral region: Secondary | ICD-10-CM | POA: Diagnosis not present

## 2013-08-06 DIAGNOSIS — M961 Postlaminectomy syndrome, not elsewhere classified: Secondary | ICD-10-CM | POA: Diagnosis not present

## 2013-08-06 DIAGNOSIS — IMO0002 Reserved for concepts with insufficient information to code with codable children: Secondary | ICD-10-CM | POA: Diagnosis not present

## 2013-08-13 DIAGNOSIS — D234 Other benign neoplasm of skin of scalp and neck: Secondary | ICD-10-CM | POA: Diagnosis not present

## 2013-08-13 DIAGNOSIS — L280 Lichen simplex chronicus: Secondary | ICD-10-CM | POA: Diagnosis not present

## 2013-08-22 DIAGNOSIS — F639 Impulse disorder, unspecified: Secondary | ICD-10-CM | POA: Diagnosis not present

## 2013-08-22 DIAGNOSIS — R7881 Bacteremia: Secondary | ICD-10-CM | POA: Diagnosis not present

## 2013-08-22 DIAGNOSIS — M961 Postlaminectomy syndrome, not elsewhere classified: Secondary | ICD-10-CM | POA: Diagnosis not present

## 2013-08-22 DIAGNOSIS — M5137 Other intervertebral disc degeneration, lumbosacral region: Secondary | ICD-10-CM | POA: Diagnosis not present

## 2013-08-22 DIAGNOSIS — R5383 Other fatigue: Secondary | ICD-10-CM | POA: Diagnosis not present

## 2013-08-22 DIAGNOSIS — Z794 Long term (current) use of insulin: Secondary | ICD-10-CM | POA: Diagnosis not present

## 2013-08-22 DIAGNOSIS — R109 Unspecified abdominal pain: Secondary | ICD-10-CM | POA: Diagnosis not present

## 2013-08-22 DIAGNOSIS — N390 Urinary tract infection, site not specified: Secondary | ICD-10-CM | POA: Diagnosis not present

## 2013-08-22 DIAGNOSIS — E119 Type 2 diabetes mellitus without complications: Secondary | ICD-10-CM | POA: Diagnosis not present

## 2013-08-22 DIAGNOSIS — R6889 Other general symptoms and signs: Secondary | ICD-10-CM | POA: Diagnosis not present

## 2013-08-22 DIAGNOSIS — IMO0002 Reserved for concepts with insufficient information to code with codable children: Secondary | ICD-10-CM | POA: Diagnosis not present

## 2013-08-22 DIAGNOSIS — Z79899 Other long term (current) drug therapy: Secondary | ICD-10-CM | POA: Diagnosis not present

## 2013-08-22 DIAGNOSIS — R5381 Other malaise: Secondary | ICD-10-CM | POA: Diagnosis not present

## 2013-08-22 DIAGNOSIS — R1110 Vomiting, unspecified: Secondary | ICD-10-CM | POA: Diagnosis not present

## 2013-08-22 DIAGNOSIS — R509 Fever, unspecified: Secondary | ICD-10-CM | POA: Diagnosis not present

## 2013-08-22 DIAGNOSIS — G894 Chronic pain syndrome: Secondary | ICD-10-CM | POA: Diagnosis not present

## 2013-08-23 DIAGNOSIS — R509 Fever, unspecified: Secondary | ICD-10-CM | POA: Diagnosis not present

## 2013-08-23 DIAGNOSIS — F411 Generalized anxiety disorder: Secondary | ICD-10-CM | POA: Diagnosis not present

## 2013-08-23 DIAGNOSIS — F429 Obsessive-compulsive disorder, unspecified: Secondary | ICD-10-CM | POA: Diagnosis not present

## 2013-08-23 DIAGNOSIS — R7881 Bacteremia: Secondary | ICD-10-CM | POA: Diagnosis not present

## 2013-08-23 DIAGNOSIS — F329 Major depressive disorder, single episode, unspecified: Secondary | ICD-10-CM | POA: Diagnosis not present

## 2013-08-23 DIAGNOSIS — Z79899 Other long term (current) drug therapy: Secondary | ICD-10-CM | POA: Diagnosis not present

## 2013-08-23 DIAGNOSIS — F3289 Other specified depressive episodes: Secondary | ICD-10-CM | POA: Diagnosis not present

## 2013-08-23 DIAGNOSIS — E119 Type 2 diabetes mellitus without complications: Secondary | ICD-10-CM | POA: Diagnosis not present

## 2013-08-23 DIAGNOSIS — Z794 Long term (current) use of insulin: Secondary | ICD-10-CM | POA: Diagnosis not present

## 2013-08-28 DIAGNOSIS — L981 Factitial dermatitis: Secondary | ICD-10-CM | POA: Diagnosis not present

## 2013-08-28 DIAGNOSIS — L259 Unspecified contact dermatitis, unspecified cause: Secondary | ICD-10-CM | POA: Diagnosis not present

## 2013-09-10 DIAGNOSIS — R197 Diarrhea, unspecified: Secondary | ICD-10-CM | POA: Diagnosis not present

## 2013-09-10 DIAGNOSIS — E119 Type 2 diabetes mellitus without complications: Secondary | ICD-10-CM | POA: Diagnosis not present

## 2013-09-10 DIAGNOSIS — B86 Scabies: Secondary | ICD-10-CM | POA: Diagnosis not present

## 2013-09-10 DIAGNOSIS — N649 Disorder of breast, unspecified: Secondary | ICD-10-CM | POA: Diagnosis not present

## 2013-09-24 DIAGNOSIS — R509 Fever, unspecified: Secondary | ICD-10-CM | POA: Diagnosis not present

## 2013-09-24 DIAGNOSIS — N879 Dysplasia of cervix uteri, unspecified: Secondary | ICD-10-CM | POA: Diagnosis not present

## 2013-09-24 DIAGNOSIS — F339 Major depressive disorder, recurrent, unspecified: Secondary | ICD-10-CM | POA: Diagnosis not present

## 2013-09-24 DIAGNOSIS — Z01419 Encounter for gynecological examination (general) (routine) without abnormal findings: Secondary | ICD-10-CM | POA: Diagnosis not present

## 2013-09-24 DIAGNOSIS — N649 Disorder of breast, unspecified: Secondary | ICD-10-CM | POA: Diagnosis not present

## 2013-09-24 DIAGNOSIS — R21 Rash and other nonspecific skin eruption: Secondary | ICD-10-CM | POA: Diagnosis not present

## 2013-09-24 DIAGNOSIS — Z7251 High risk heterosexual behavior: Secondary | ICD-10-CM | POA: Diagnosis not present

## 2013-09-24 DIAGNOSIS — E119 Type 2 diabetes mellitus without complications: Secondary | ICD-10-CM | POA: Diagnosis not present

## 2013-09-24 DIAGNOSIS — Z124 Encounter for screening for malignant neoplasm of cervix: Secondary | ICD-10-CM | POA: Diagnosis not present

## 2013-10-03 DIAGNOSIS — M961 Postlaminectomy syndrome, not elsewhere classified: Secondary | ICD-10-CM | POA: Diagnosis not present

## 2013-10-03 DIAGNOSIS — IMO0002 Reserved for concepts with insufficient information to code with codable children: Secondary | ICD-10-CM | POA: Diagnosis not present

## 2013-10-03 DIAGNOSIS — E119 Type 2 diabetes mellitus without complications: Secondary | ICD-10-CM | POA: Diagnosis not present

## 2013-10-03 DIAGNOSIS — S42309A Unspecified fracture of shaft of humerus, unspecified arm, initial encounter for closed fracture: Secondary | ICD-10-CM | POA: Diagnosis not present

## 2013-10-08 DIAGNOSIS — Z1231 Encounter for screening mammogram for malignant neoplasm of breast: Secondary | ICD-10-CM | POA: Diagnosis not present

## 2013-10-11 DIAGNOSIS — F3289 Other specified depressive episodes: Secondary | ICD-10-CM | POA: Diagnosis not present

## 2013-10-11 DIAGNOSIS — E119 Type 2 diabetes mellitus without complications: Secondary | ICD-10-CM | POA: Diagnosis not present

## 2013-10-11 DIAGNOSIS — M5137 Other intervertebral disc degeneration, lumbosacral region: Secondary | ICD-10-CM | POA: Diagnosis not present

## 2013-10-11 DIAGNOSIS — IMO0002 Reserved for concepts with insufficient information to code with codable children: Secondary | ICD-10-CM | POA: Diagnosis not present

## 2013-10-11 DIAGNOSIS — G894 Chronic pain syndrome: Secondary | ICD-10-CM | POA: Diagnosis not present

## 2013-10-11 DIAGNOSIS — Z794 Long term (current) use of insulin: Secondary | ICD-10-CM | POA: Diagnosis not present

## 2013-10-11 DIAGNOSIS — Z9181 History of falling: Secondary | ICD-10-CM | POA: Diagnosis not present

## 2013-10-11 DIAGNOSIS — F329 Major depressive disorder, single episode, unspecified: Secondary | ICD-10-CM | POA: Diagnosis not present

## 2013-10-16 DIAGNOSIS — Z79899 Other long term (current) drug therapy: Secondary | ICD-10-CM | POA: Diagnosis not present

## 2013-10-16 DIAGNOSIS — E119 Type 2 diabetes mellitus without complications: Secondary | ICD-10-CM | POA: Diagnosis not present

## 2013-10-16 DIAGNOSIS — W010XXA Fall on same level from slipping, tripping and stumbling without subsequent striking against object, initial encounter: Secondary | ICD-10-CM | POA: Diagnosis not present

## 2013-10-16 DIAGNOSIS — S300XXA Contusion of lower back and pelvis, initial encounter: Secondary | ICD-10-CM | POA: Diagnosis not present

## 2013-10-16 DIAGNOSIS — G894 Chronic pain syndrome: Secondary | ICD-10-CM | POA: Diagnosis not present

## 2013-10-16 DIAGNOSIS — Z794 Long term (current) use of insulin: Secondary | ICD-10-CM | POA: Diagnosis not present

## 2013-10-16 DIAGNOSIS — IMO0002 Reserved for concepts with insufficient information to code with codable children: Secondary | ICD-10-CM | POA: Diagnosis not present

## 2013-10-16 DIAGNOSIS — M5137 Other intervertebral disc degeneration, lumbosacral region: Secondary | ICD-10-CM | POA: Diagnosis not present

## 2013-10-16 DIAGNOSIS — S42309A Unspecified fracture of shaft of humerus, unspecified arm, initial encounter for closed fracture: Secondary | ICD-10-CM | POA: Diagnosis not present

## 2013-10-16 DIAGNOSIS — F3289 Other specified depressive episodes: Secondary | ICD-10-CM | POA: Diagnosis not present

## 2013-10-16 DIAGNOSIS — F329 Major depressive disorder, single episode, unspecified: Secondary | ICD-10-CM | POA: Diagnosis not present

## 2013-10-17 DIAGNOSIS — Z794 Long term (current) use of insulin: Secondary | ICD-10-CM | POA: Diagnosis not present

## 2013-10-17 DIAGNOSIS — F3289 Other specified depressive episodes: Secondary | ICD-10-CM | POA: Diagnosis not present

## 2013-10-17 DIAGNOSIS — E119 Type 2 diabetes mellitus without complications: Secondary | ICD-10-CM | POA: Diagnosis not present

## 2013-10-17 DIAGNOSIS — M961 Postlaminectomy syndrome, not elsewhere classified: Secondary | ICD-10-CM | POA: Diagnosis not present

## 2013-10-17 DIAGNOSIS — F988 Other specified behavioral and emotional disorders with onset usually occurring in childhood and adolescence: Secondary | ICD-10-CM | POA: Diagnosis not present

## 2013-10-17 DIAGNOSIS — IMO0002 Reserved for concepts with insufficient information to code with codable children: Secondary | ICD-10-CM | POA: Diagnosis not present

## 2013-10-17 DIAGNOSIS — M5137 Other intervertebral disc degeneration, lumbosacral region: Secondary | ICD-10-CM | POA: Diagnosis not present

## 2013-10-17 DIAGNOSIS — F329 Major depressive disorder, single episode, unspecified: Secondary | ICD-10-CM | POA: Diagnosis not present

## 2013-10-17 DIAGNOSIS — G894 Chronic pain syndrome: Secondary | ICD-10-CM | POA: Diagnosis not present

## 2013-10-17 DIAGNOSIS — S42309A Unspecified fracture of shaft of humerus, unspecified arm, initial encounter for closed fracture: Secondary | ICD-10-CM | POA: Diagnosis not present

## 2013-10-18 DIAGNOSIS — F3289 Other specified depressive episodes: Secondary | ICD-10-CM | POA: Diagnosis not present

## 2013-10-18 DIAGNOSIS — Z794 Long term (current) use of insulin: Secondary | ICD-10-CM | POA: Diagnosis not present

## 2013-10-18 DIAGNOSIS — F329 Major depressive disorder, single episode, unspecified: Secondary | ICD-10-CM | POA: Diagnosis not present

## 2013-10-18 DIAGNOSIS — G894 Chronic pain syndrome: Secondary | ICD-10-CM | POA: Diagnosis not present

## 2013-10-18 DIAGNOSIS — E119 Type 2 diabetes mellitus without complications: Secondary | ICD-10-CM | POA: Diagnosis not present

## 2013-10-18 DIAGNOSIS — M5137 Other intervertebral disc degeneration, lumbosacral region: Secondary | ICD-10-CM | POA: Diagnosis not present

## 2013-10-18 DIAGNOSIS — IMO0002 Reserved for concepts with insufficient information to code with codable children: Secondary | ICD-10-CM | POA: Diagnosis not present

## 2013-10-22 DIAGNOSIS — F3289 Other specified depressive episodes: Secondary | ICD-10-CM | POA: Diagnosis not present

## 2013-10-22 DIAGNOSIS — E119 Type 2 diabetes mellitus without complications: Secondary | ICD-10-CM | POA: Diagnosis not present

## 2013-10-22 DIAGNOSIS — Z23 Encounter for immunization: Secondary | ICD-10-CM | POA: Diagnosis not present

## 2013-10-22 DIAGNOSIS — S79919A Unspecified injury of unspecified hip, initial encounter: Secondary | ICD-10-CM | POA: Diagnosis not present

## 2013-10-22 DIAGNOSIS — Z794 Long term (current) use of insulin: Secondary | ICD-10-CM | POA: Diagnosis not present

## 2013-10-22 DIAGNOSIS — S81809A Unspecified open wound, unspecified lower leg, initial encounter: Secondary | ICD-10-CM | POA: Diagnosis not present

## 2013-10-22 DIAGNOSIS — W292XXA Contact with other powered household machinery, initial encounter: Secondary | ICD-10-CM | POA: Diagnosis not present

## 2013-10-22 DIAGNOSIS — S41009A Unspecified open wound of unspecified shoulder, initial encounter: Secondary | ICD-10-CM | POA: Diagnosis not present

## 2013-10-22 DIAGNOSIS — Z79899 Other long term (current) drug therapy: Secondary | ICD-10-CM | POA: Diagnosis not present

## 2013-10-22 DIAGNOSIS — S71109A Unspecified open wound, unspecified thigh, initial encounter: Secondary | ICD-10-CM | POA: Diagnosis not present

## 2013-10-22 DIAGNOSIS — F17200 Nicotine dependence, unspecified, uncomplicated: Secondary | ICD-10-CM | POA: Diagnosis not present

## 2013-10-22 DIAGNOSIS — S71009A Unspecified open wound, unspecified hip, initial encounter: Secondary | ICD-10-CM | POA: Diagnosis not present

## 2013-10-22 DIAGNOSIS — F329 Major depressive disorder, single episode, unspecified: Secondary | ICD-10-CM | POA: Diagnosis not present

## 2013-10-22 DIAGNOSIS — F411 Generalized anxiety disorder: Secondary | ICD-10-CM | POA: Diagnosis not present

## 2013-10-23 DIAGNOSIS — S79919A Unspecified injury of unspecified hip, initial encounter: Secondary | ICD-10-CM | POA: Diagnosis not present

## 2013-10-24 DIAGNOSIS — F3289 Other specified depressive episodes: Secondary | ICD-10-CM | POA: Diagnosis not present

## 2013-10-24 DIAGNOSIS — F329 Major depressive disorder, single episode, unspecified: Secondary | ICD-10-CM | POA: Diagnosis not present

## 2013-10-24 DIAGNOSIS — IMO0002 Reserved for concepts with insufficient information to code with codable children: Secondary | ICD-10-CM | POA: Diagnosis not present

## 2013-10-24 DIAGNOSIS — M5137 Other intervertebral disc degeneration, lumbosacral region: Secondary | ICD-10-CM | POA: Diagnosis not present

## 2013-10-24 DIAGNOSIS — Z794 Long term (current) use of insulin: Secondary | ICD-10-CM | POA: Diagnosis not present

## 2013-10-24 DIAGNOSIS — E119 Type 2 diabetes mellitus without complications: Secondary | ICD-10-CM | POA: Diagnosis not present

## 2013-10-24 DIAGNOSIS — G894 Chronic pain syndrome: Secondary | ICD-10-CM | POA: Diagnosis not present

## 2013-11-14 DIAGNOSIS — E860 Dehydration: Secondary | ICD-10-CM | POA: Diagnosis not present

## 2013-11-14 DIAGNOSIS — M961 Postlaminectomy syndrome, not elsewhere classified: Secondary | ICD-10-CM | POA: Diagnosis not present

## 2013-11-14 DIAGNOSIS — IMO0002 Reserved for concepts with insufficient information to code with codable children: Secondary | ICD-10-CM | POA: Diagnosis not present

## 2013-11-14 DIAGNOSIS — F988 Other specified behavioral and emotional disorders with onset usually occurring in childhood and adolescence: Secondary | ICD-10-CM | POA: Diagnosis not present

## 2013-11-14 DIAGNOSIS — S42309A Unspecified fracture of shaft of humerus, unspecified arm, initial encounter for closed fracture: Secondary | ICD-10-CM | POA: Diagnosis not present

## 2014-02-06 DIAGNOSIS — M961 Postlaminectomy syndrome, not elsewhere classified: Secondary | ICD-10-CM | POA: Diagnosis not present

## 2014-02-06 DIAGNOSIS — F988 Other specified behavioral and emotional disorders with onset usually occurring in childhood and adolescence: Secondary | ICD-10-CM | POA: Diagnosis not present

## 2014-02-06 DIAGNOSIS — IMO0002 Reserved for concepts with insufficient information to code with codable children: Secondary | ICD-10-CM | POA: Diagnosis not present

## 2014-02-06 DIAGNOSIS — S42309A Unspecified fracture of shaft of humerus, unspecified arm, initial encounter for closed fracture: Secondary | ICD-10-CM | POA: Diagnosis not present

## 2014-02-18 DIAGNOSIS — M545 Low back pain, unspecified: Secondary | ICD-10-CM | POA: Diagnosis not present

## 2014-02-19 DIAGNOSIS — E860 Dehydration: Secondary | ICD-10-CM | POA: Diagnosis not present

## 2014-02-19 DIAGNOSIS — M961 Postlaminectomy syndrome, not elsewhere classified: Secondary | ICD-10-CM | POA: Diagnosis not present

## 2014-02-19 DIAGNOSIS — S42309A Unspecified fracture of shaft of humerus, unspecified arm, initial encounter for closed fracture: Secondary | ICD-10-CM | POA: Diagnosis not present

## 2014-02-19 DIAGNOSIS — IMO0002 Reserved for concepts with insufficient information to code with codable children: Secondary | ICD-10-CM | POA: Diagnosis not present

## 2014-03-20 DIAGNOSIS — J209 Acute bronchitis, unspecified: Secondary | ICD-10-CM | POA: Diagnosis not present

## 2014-03-20 DIAGNOSIS — Z720 Tobacco use: Secondary | ICD-10-CM | POA: Diagnosis not present

## 2014-04-01 DIAGNOSIS — S42302A Unspecified fracture of shaft of humerus, left arm, initial encounter for closed fracture: Secondary | ICD-10-CM | POA: Diagnosis not present

## 2014-04-01 DIAGNOSIS — M961 Postlaminectomy syndrome, not elsewhere classified: Secondary | ICD-10-CM | POA: Diagnosis not present

## 2014-04-01 DIAGNOSIS — F339 Major depressive disorder, recurrent, unspecified: Secondary | ICD-10-CM | POA: Diagnosis not present

## 2014-04-01 DIAGNOSIS — F419 Anxiety disorder, unspecified: Secondary | ICD-10-CM | POA: Diagnosis not present

## 2014-04-06 DIAGNOSIS — M79622 Pain in left upper arm: Secondary | ICD-10-CM | POA: Diagnosis not present

## 2014-04-06 DIAGNOSIS — S46902A Unspecified injury of unspecified muscle, fascia and tendon at shoulder and upper arm level, left arm, initial encounter: Secondary | ICD-10-CM | POA: Diagnosis not present

## 2014-04-06 DIAGNOSIS — M84422D Pathological fracture, left humerus, subsequent encounter for fracture with routine healing: Secondary | ICD-10-CM | POA: Diagnosis not present

## 2014-04-06 DIAGNOSIS — S42302A Unspecified fracture of shaft of humerus, left arm, initial encounter for closed fracture: Secondary | ICD-10-CM | POA: Diagnosis not present

## 2014-04-06 DIAGNOSIS — W010XXA Fall on same level from slipping, tripping and stumbling without subsequent striking against object, initial encounter: Secondary | ICD-10-CM | POA: Diagnosis not present

## 2014-04-08 ENCOUNTER — Encounter (HOSPITAL_COMMUNITY): Payer: Self-pay | Admitting: *Deleted

## 2014-04-30 DIAGNOSIS — Z79899 Other long term (current) drug therapy: Secondary | ICD-10-CM | POA: Diagnosis not present

## 2014-04-30 DIAGNOSIS — F339 Major depressive disorder, recurrent, unspecified: Secondary | ICD-10-CM | POA: Diagnosis not present

## 2014-04-30 DIAGNOSIS — F419 Anxiety disorder, unspecified: Secondary | ICD-10-CM | POA: Diagnosis not present

## 2014-04-30 DIAGNOSIS — M5416 Radiculopathy, lumbar region: Secondary | ICD-10-CM | POA: Diagnosis not present

## 2014-04-30 DIAGNOSIS — M961 Postlaminectomy syndrome, not elsewhere classified: Secondary | ICD-10-CM | POA: Diagnosis not present

## 2014-05-13 DIAGNOSIS — J309 Allergic rhinitis, unspecified: Secondary | ICD-10-CM | POA: Diagnosis not present

## 2014-05-13 DIAGNOSIS — S42302A Unspecified fracture of shaft of humerus, left arm, initial encounter for closed fracture: Secondary | ICD-10-CM | POA: Diagnosis not present

## 2014-05-13 DIAGNOSIS — J00 Acute nasopharyngitis [common cold]: Secondary | ICD-10-CM | POA: Diagnosis not present

## 2014-05-13 DIAGNOSIS — M5416 Radiculopathy, lumbar region: Secondary | ICD-10-CM | POA: Diagnosis not present

## 2014-07-08 DIAGNOSIS — E119 Type 2 diabetes mellitus without complications: Secondary | ICD-10-CM | POA: Diagnosis not present

## 2014-07-08 DIAGNOSIS — E559 Vitamin D deficiency, unspecified: Secondary | ICD-10-CM | POA: Diagnosis not present

## 2014-07-08 DIAGNOSIS — M5416 Radiculopathy, lumbar region: Secondary | ICD-10-CM | POA: Diagnosis not present

## 2014-07-08 DIAGNOSIS — S42302A Unspecified fracture of shaft of humerus, left arm, initial encounter for closed fracture: Secondary | ICD-10-CM | POA: Diagnosis not present

## 2014-09-09 DIAGNOSIS — G629 Polyneuropathy, unspecified: Secondary | ICD-10-CM | POA: Diagnosis not present

## 2014-09-09 DIAGNOSIS — F419 Anxiety disorder, unspecified: Secondary | ICD-10-CM | POA: Diagnosis not present

## 2014-09-09 DIAGNOSIS — E559 Vitamin D deficiency, unspecified: Secondary | ICD-10-CM | POA: Diagnosis not present

## 2014-09-09 DIAGNOSIS — G894 Chronic pain syndrome: Secondary | ICD-10-CM | POA: Diagnosis not present

## 2014-09-09 DIAGNOSIS — E119 Type 2 diabetes mellitus without complications: Secondary | ICD-10-CM | POA: Diagnosis not present

## 2014-09-23 DIAGNOSIS — E119 Type 2 diabetes mellitus without complications: Secondary | ICD-10-CM | POA: Diagnosis not present

## 2014-09-23 DIAGNOSIS — F419 Anxiety disorder, unspecified: Secondary | ICD-10-CM | POA: Diagnosis not present

## 2014-09-23 DIAGNOSIS — E559 Vitamin D deficiency, unspecified: Secondary | ICD-10-CM | POA: Diagnosis not present

## 2014-09-23 DIAGNOSIS — F40298 Other specified phobia: Secondary | ICD-10-CM | POA: Diagnosis not present

## 2014-09-23 DIAGNOSIS — S42302A Unspecified fracture of shaft of humerus, left arm, initial encounter for closed fracture: Secondary | ICD-10-CM | POA: Diagnosis not present

## 2015-01-16 DIAGNOSIS — R21 Rash and other nonspecific skin eruption: Secondary | ICD-10-CM | POA: Diagnosis not present

## 2015-01-22 DIAGNOSIS — B86 Scabies: Secondary | ICD-10-CM | POA: Diagnosis not present

## 2015-01-22 DIAGNOSIS — L209 Atopic dermatitis, unspecified: Secondary | ICD-10-CM | POA: Diagnosis not present

## 2015-08-19 DIAGNOSIS — M1711 Unilateral primary osteoarthritis, right knee: Secondary | ICD-10-CM | POA: Diagnosis not present

## 2015-08-19 DIAGNOSIS — J209 Acute bronchitis, unspecified: Secondary | ICD-10-CM | POA: Diagnosis not present

## 2015-09-15 DIAGNOSIS — H1013 Acute atopic conjunctivitis, bilateral: Secondary | ICD-10-CM | POA: Diagnosis not present

## 2015-09-15 DIAGNOSIS — H40033 Anatomical narrow angle, bilateral: Secondary | ICD-10-CM | POA: Diagnosis not present

## 2015-10-01 DIAGNOSIS — Z5181 Encounter for therapeutic drug level monitoring: Secondary | ICD-10-CM | POA: Diagnosis not present

## 2015-10-01 DIAGNOSIS — F411 Generalized anxiety disorder: Secondary | ICD-10-CM | POA: Diagnosis not present

## 2015-10-01 DIAGNOSIS — Z79899 Other long term (current) drug therapy: Secondary | ICD-10-CM | POA: Diagnosis not present

## 2015-10-17 DIAGNOSIS — E559 Vitamin D deficiency, unspecified: Secondary | ICD-10-CM | POA: Diagnosis not present

## 2015-10-17 DIAGNOSIS — N912 Amenorrhea, unspecified: Secondary | ICD-10-CM | POA: Diagnosis not present

## 2015-10-17 DIAGNOSIS — E1140 Type 2 diabetes mellitus with diabetic neuropathy, unspecified: Secondary | ICD-10-CM | POA: Diagnosis not present

## 2015-10-17 DIAGNOSIS — F411 Generalized anxiety disorder: Secondary | ICD-10-CM | POA: Diagnosis not present

## 2015-10-17 DIAGNOSIS — F331 Major depressive disorder, recurrent, moderate: Secondary | ICD-10-CM | POA: Diagnosis not present

## 2015-10-17 DIAGNOSIS — G894 Chronic pain syndrome: Secondary | ICD-10-CM | POA: Diagnosis not present

## 2015-11-17 DIAGNOSIS — Z5181 Encounter for therapeutic drug level monitoring: Secondary | ICD-10-CM | POA: Diagnosis not present

## 2015-11-17 DIAGNOSIS — F411 Generalized anxiety disorder: Secondary | ICD-10-CM | POA: Diagnosis not present

## 2015-11-17 DIAGNOSIS — Z79899 Other long term (current) drug therapy: Secondary | ICD-10-CM | POA: Diagnosis not present

## 2015-11-17 DIAGNOSIS — F330 Major depressive disorder, recurrent, mild: Secondary | ICD-10-CM | POA: Diagnosis not present

## 2015-12-15 DIAGNOSIS — Z79899 Other long term (current) drug therapy: Secondary | ICD-10-CM | POA: Diagnosis not present

## 2015-12-15 DIAGNOSIS — Z5181 Encounter for therapeutic drug level monitoring: Secondary | ICD-10-CM | POA: Diagnosis not present

## 2015-12-15 DIAGNOSIS — F411 Generalized anxiety disorder: Secondary | ICD-10-CM | POA: Diagnosis not present

## 2015-12-22 ENCOUNTER — Encounter (HOSPITAL_COMMUNITY): Payer: Self-pay | Admitting: Emergency Medicine

## 2015-12-22 ENCOUNTER — Emergency Department (HOSPITAL_COMMUNITY)
Admission: EM | Admit: 2015-12-22 | Discharge: 2015-12-22 | Disposition: A | Discharge status: Home / Self Care | Payer: Medicare Other | Attending: Emergency Medicine | Admitting: Emergency Medicine

## 2015-12-22 ENCOUNTER — Other Ambulatory Visit: Payer: Self-pay

## 2015-12-22 DIAGNOSIS — Z794 Long term (current) use of insulin: Secondary | ICD-10-CM | POA: Diagnosis not present

## 2015-12-22 DIAGNOSIS — R0789 Other chest pain: Secondary | ICD-10-CM | POA: Diagnosis not present

## 2015-12-22 DIAGNOSIS — F17210 Nicotine dependence, cigarettes, uncomplicated: Secondary | ICD-10-CM | POA: Diagnosis not present

## 2015-12-22 DIAGNOSIS — E119 Type 2 diabetes mellitus without complications: Secondary | ICD-10-CM | POA: Insufficient documentation

## 2015-12-22 DIAGNOSIS — R42 Dizziness and giddiness: Secondary | ICD-10-CM | POA: Insufficient documentation

## 2015-12-22 DIAGNOSIS — R079 Chest pain, unspecified: Secondary | ICD-10-CM | POA: Diagnosis not present

## 2015-12-22 DIAGNOSIS — Z79899 Other long term (current) drug therapy: Secondary | ICD-10-CM | POA: Diagnosis not present

## 2015-12-22 DIAGNOSIS — R002 Palpitations: Secondary | ICD-10-CM | POA: Diagnosis present

## 2015-12-22 DIAGNOSIS — N390 Urinary tract infection, site not specified: Secondary | ICD-10-CM | POA: Diagnosis not present

## 2015-12-22 LAB — CBG MONITORING, ED: Glucose-Capillary: 269 mg/dL — ABNORMAL HIGH (ref 65–99)

## 2015-12-22 LAB — URINALYSIS, ROUTINE W REFLEX MICROSCOPIC
GLUCOSE, UA: 250 mg/dL — AB
HGB URINE DIPSTICK: NEGATIVE
Ketones, ur: 15 mg/dL — AB
Nitrite: NEGATIVE
PH: 6 (ref 5.0–8.0)
Protein, ur: NEGATIVE mg/dL
SPECIFIC GRAVITY, URINE: 1.024 (ref 1.005–1.030)

## 2015-12-22 LAB — BASIC METABOLIC PANEL
Anion gap: 12 (ref 5–15)
BUN: 7 mg/dL (ref 6–20)
CHLORIDE: 101 mmol/L (ref 101–111)
CO2: 24 mmol/L (ref 22–32)
CREATININE: 0.79 mg/dL (ref 0.44–1.00)
Calcium: 8.9 mg/dL (ref 8.9–10.3)
GFR calc Af Amer: >60 mL/min (ref >60)
GFR calc non Af Amer: >60 mL/min (ref >60)
GLUCOSE: 256 mg/dL — AB (ref 65–99)
Potassium: 3.6 mmol/L (ref 3.5–5.1)
SODIUM: 137 mmol/L (ref 135–145)

## 2015-12-22 LAB — URINE MICROSCOPIC-ADD ON

## 2015-12-22 LAB — CBC
HEMATOCRIT: 50.6 % — AB (ref 36.0–46.0)
Hemoglobin: 17.5 g/dL — ABNORMAL HIGH (ref 12.0–15.0)
MCH: 30.1 pg (ref 26.0–34.0)
MCHC: 34.6 g/dL (ref 30.0–36.0)
MCV: 86.9 fL (ref 78.0–100.0)
PLATELETS: 192 10*3/uL (ref 150–400)
RBC: 5.82 MIL/uL — ABNORMAL HIGH (ref 3.87–5.11)
RDW: 13.8 % (ref 11.5–15.5)
WBC: 10.5 10*3/uL (ref 4.0–10.5)

## 2015-12-22 LAB — RAPID URINE DRUG SCREEN, HOSP PERFORMED
AMPHETAMINES: NOT DETECTED
BARBITURATES: NOT DETECTED
BENZODIAZEPINES: NOT DETECTED
COCAINE: NOT DETECTED
Opiates: NOT DETECTED
TETRAHYDROCANNABINOL: NOT DETECTED

## 2015-12-22 LAB — SALICYLATE LEVEL: Salicylate Lvl: <4 mg/dL (ref 2.8–30.0)

## 2015-12-22 LAB — I-STAT TROPONIN, ED: Troponin i, poc: 0.01 ng/mL (ref 0.00–0.08)

## 2015-12-22 LAB — POC URINE PREG, ED: PREG TEST UR: NEGATIVE

## 2015-12-22 LAB — ACETAMINOPHEN LEVEL: Acetaminophen (Tylenol), Serum: <10 ug/mL — ABNORMAL LOW (ref 10–30)

## 2015-12-22 LAB — ETHANOL

## 2015-12-22 MED ORDER — SULFAMETHOXAZOLE-TRIMETHOPRIM 800-160 MG PO TABS: 1.0000 | ORAL_TABLET | Freq: Two times a day (BID) | ORAL | Status: DC

## 2015-12-22 NOTE — Discharge Instructions (Signed)
You were seen today for chest pain and urinary symptoms. Take the Bactrim as prescribed and be sure to complete the entire 3 day course. If you experience a rash while taking this medicine, discontinue his antibiotic and call your primary care provider or return to the emergency department. If you have any thoughts of suicide or homicide please call 911.  Return to emergency department if you experience worsening chest pain, dizziness, you pass out, nausea, vomiting, fever or you have any thoughts of harming yourself or others.  Nonspecific Chest Pain  Chest pain can be caused by many different conditions. There is always a chance that your pain could be related to something serious, such as a heart attack or a blood clot in your lungs. Chest pain can also be caused by conditions that are not life-threatening. If you have chest pain, it is very important to follow up with your health care provider. CAUSES  Chest pain can be caused by:  Heartburn.  Pneumonia or bronchitis.  Anxiety or stress.  Inflammation around your heart (pericarditis) or lung (pleuritis or pleurisy).  A blood clot in your lung.  A collapsed lung (pneumothorax). It can develop suddenly on its own (spontaneous pneumothorax) or from trauma to the chest.  Shingles infection (varicella-zoster virus).  Heart attack.  Damage to the bones, muscles, and cartilage that make up your chest wall. This can include:  Bruised bones due to injury.  Strained muscles or cartilage due to frequent or repeated coughing or overwork.  Fracture to one or more ribs.  Sore cartilage due to inflammation (costochondritis). RISK FACTORS  Risk factors for chest pain may include:  Activities that increase your risk for trauma or injury to your chest.  Respiratory infections or conditions that cause frequent coughing.  Medical conditions or overeating that can cause heartburn.  Heart disease or family history of heart  disease.  Conditions or health behaviors that increase your risk of developing a blood clot.  Having had chicken pox (varicella zoster). SIGNS AND SYMPTOMS Chest pain can feel like:  Burning or tingling on the surface of your chest or deep in your chest.  Crushing, pressure, aching, or squeezing pain.  Dull or sharp pain that is worse when you move, cough, or take a deep breath.  Pain that is also felt in your back, neck, shoulder, or arm, or pain that spreads to any of these areas. Your chest pain may come and go, or it may stay constant. DIAGNOSIS Lab tests or other studies may be needed to find the cause of your pain. Your health care provider may have you take a test called an ambulatory ECG (electrocardiogram). An ECG records your heartbeat patterns at the time the test is performed. You may also have other tests, such as:  Transthoracic echocardiogram (TTE). During echocardiography, sound waves are used to create a picture of all of the heart structures and to look at how blood flows through your heart.  Transesophageal echocardiogram (TEE).This is a more advanced imaging test that obtains images from inside your body. It allows your health care provider to see your heart in finer detail.  Cardiac monitoring. This allows your health care provider to monitor your heart rate and rhythm in real time.  Holter monitor. This is a portable device that records your heartbeat and can help to diagnose abnormal heartbeats. It allows your health care provider to track your heart activity for several days, if needed.  Stress tests. These can be done through exercise  or by taking medicine that makes your heart beat more quickly.  Blood tests.  Imaging tests. TREATMENT  Your treatment depends on what is causing your chest pain. Treatment may include:  Medicines. These may include:  Acid blockers for heartburn.  Anti-inflammatory medicine.  Pain medicine for inflammatory  conditions.  Antibiotic medicine, if an infection is present.  Medicines to dissolve blood clots.  Medicines to treat coronary artery disease.  Supportive care for conditions that do not require medicines. This may include:  Resting.  Applying heat or cold packs to injured areas.  Limiting activities until pain decreases. HOME CARE INSTRUCTIONS  If you were prescribed an antibiotic medicine, finish it all even if you start to feel better.  Avoid any activities that bring on chest pain.  Do not use any tobacco products, including cigarettes, chewing tobacco, or electronic cigarettes. If you need help quitting, ask your health care provider.  Do not drink alcohol.  Take medicines only as directed by your health care provider.  Keep all follow-up visits as directed by your health care provider. This is important. This includes any further testing if your chest pain does not go away.  If heartburn is the cause for your chest pain, you may be told to keep your head raised (elevated) while sleeping. This reduces the chance that acid will go from your stomach into your esophagus.  Make lifestyle changes as directed by your health care provider. These may include:  Getting regular exercise. Ask your health care provider to suggest some activities that are safe for you.  Eating a heart-healthy diet. A registered dietitian can help you to learn healthy eating options.  Maintaining a healthy weight.  Managing diabetes, if necessary.  Reducing stress. SEEK MEDICAL CARE IF:  Your chest pain does not go away after treatment.  You have a rash with blisters on your chest.  You have a fever. SEEK IMMEDIATE MEDICAL CARE IF:   Your chest pain is worse.  You have an increasing cough, or you cough up blood.  You have severe abdominal pain.  You have severe weakness.  You faint.  You have chills.  You have sudden, unexplained chest discomfort.  You have sudden, unexplained  discomfort in your arms, back, neck, or jaw.  You have shortness of breath at any time.  You suddenly start to sweat, or your skin gets clammy.  You feel nauseous or you vomit.  You suddenly feel light-headed or dizzy.  Your heart begins to beat quickly, or it feels like it is skipping beats. These symptoms may represent a serious problem that is an emergency. Do not wait to see if the symptoms will go away. Get medical help right away. Call your local emergency services (911 in the [place] Do not drive yourself to the hospital.   This information is not intended to replace advice given to you by your health care provider. Make sure you discuss any questions you have with your health care provider.   Document Released: 03/03/2005 Document Revised: 06/14/2014 Document Reviewed: 12/28/2013 Elsevier Interactive Patient Education 2016 Elsevier Inc.  Urinary Tract Infection Urinary tract infections (UTIs) can develop anywhere along your urinary tract. Your urinary tract is your body's drainage system for removing wastes and extra water. Your urinary tract includes two kidneys, two ureters, a bladder, and a urethra. Your kidneys are a pair of bean-shaped organs. Each kidney is about the size of your fist. They are located below your ribs, one on each side of  your spine. CAUSES Infections are caused by microbes, which are microscopic organisms, including fungi, viruses, and bacteria. These organisms are so small that they can only be seen through a microscope. Bacteria are the microbes that most commonly cause UTIs. SYMPTOMS  Symptoms of UTIs may vary by age and gender of the patient and by the location of the infection. Symptoms in young women typically include a frequent and intense urge to urinate and a painful, burning feeling in the bladder or urethra during urination. Older women and men are more likely to be tired, shaky, and weak and have muscle aches and abdominal pain. A fever may mean the  infection is in your kidneys. Other symptoms of a kidney infection include pain in your back or sides below the ribs, nausea, and vomiting. DIAGNOSIS To diagnose a UTI, your caregiver will ask you about your symptoms. Your caregiver will also ask you to provide a urine sample. The urine sample will be tested for bacteria and white blood cells. White blood cells are made by your body to help fight infection. TREATMENT  Typically, UTIs can be treated with medication. Because most UTIs are caused by a bacterial infection, they usually can be treated with the use of antibiotics. The choice of antibiotic and length of treatment depend on your symptoms and the type of bacteria causing your infection. HOME CARE INSTRUCTIONS  If you were prescribed antibiotics, take them exactly as your caregiver instructs you. Finish the medication even if you feel better after you have only taken some of the medication.  Drink enough water and fluids to keep your urine clear or pale yellow.  Avoid caffeine, tea, and carbonated beverages. They tend to irritate your bladder.  Empty your bladder often. Avoid holding urine for long periods of time.  Empty your bladder before and after sexual intercourse.  After a bowel movement, women should cleanse from front to back. Use each tissue only once. SEEK MEDICAL CARE IF:   You have back pain.  You develop a fever.  Your symptoms do not begin to resolve within 3 days. SEEK IMMEDIATE MEDICAL CARE IF:   You have severe back pain or lower abdominal pain.  You develop chills.  You have nausea or vomiting.  You have continued burning or discomfort with urination. MAKE SURE YOU:   Understand these instructions.  Will watch your condition.  Will get help right away if you are not doing well or get worse.   This information is not intended to replace advice given to you by your health care provider. Make sure you discuss any questions you have with your health  care provider.   Document Released: 03/03/2005 Document Revised: 02/12/2015 Document Reviewed: 07/02/2011 Elsevier Interactive Patient Education [employer].
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
## 2015-12-22 NOTE — ED Provider Notes (Signed)
Patient reports she's having panic attacks and is speaking of hearing aliens. She will not elaborate further history to me. She denies wanting to harm herself or anyone else. She reports that she saw a psychiatrist many years ago presently is prescribed Elavil and Klonopin by her primary care physician Harper. Patient declines hospitalization and does not wish to talk to TTS.  Orlie Dakin, MD 12/22/15 [phone]

## 2015-12-22 NOTE — ED Notes (Signed)
Attempted to Call patient x3 with no answering machines available on either number. NO one answered phone.

## 2015-12-22 NOTE — ED Notes (Signed)
Pt refused to wait for papers. Pt just walked out of the department.

## 2015-12-22 NOTE — ED Provider Notes (Signed)
CSN: [id number]     Arrival date & time 12/22/15  0106 History   First MD Initiated Contact with Patient 12/22/15 [phone]     Chief Complaint  Patient presents with  . Dizziness  . Near Syncope  . Palpitations     (Consider location/radiation/quality/duration/timing/severity/associated sxs/prior Treatment) Patient is a 40 y.o. female presenting with dizziness, near-syncope, and palpitations.  Dizziness Associated symptoms: chest pain, nausea and palpitations   Associated symptoms: no diarrhea, no headaches, no shortness of breath, no vomiting and no weakness   Near Syncope Associated symptoms include chest pain and nausea. Pertinent negatives include no abdominal pain, chills, coughing, fever, headaches, neck pain, numbness, sore throat, vomiting or weakness.  Palpitations Associated symptoms: chest pain, dizziness, nausea and near-syncope   Associated symptoms: no cough, no numbness, no shortness of breath, no vomiting and no weakness      Patient is a 40 year old female with a history of anxiety, depression, schizophrenia, past IVDU, diabetes who presents the ED with left-sided chest pressure, left arm pain that began around midnight and lasted for roughly 10-15 minutes. Associated dizziness, heart palpitations, nausea, sweating. Patient denies shortness of breath, headache, syncope, abdominal pain, vomiting. Patient does endorse increased urinary frequency but denies dysuria or hematuria. Patient stated in triage she was scared to be alone. When I asked why, patient states that she feels for the last 4 years she is being hurt by extraterrestrials. She states she initially thought her boyfriend was trying to poison her. She states she pulled a tentacle out of her arm roughly 3 years ago and the alien is still in her body. She states in the past week these encounters have gotten worse. She states the aliens make cuts on her body and give her shots but do not leave marks. She states the aliens  are clear and look likel squids and you can only see them in the shadows. She states "they're trying to kill me" and "the government is involved" patient denies suicidal or homicidal ideations. She states tonight she felt the aliens were trying to put her to sleep to do things to her when she realized it, woke up and went outstide and began to experience chest pain.  Past Medical History  Diagnosis Date  . Anxiety   . Depression   . Infection     hepatitis c  . Schizophrenia ([place])   . Substance abuse   . Pneumonia   . Carpal tunnel syndrome   . Hepatitis C   . Diabetes mellitus without complication [facility])    Past Surgical History  Procedure Laterality Date  . Cholecystectomy    . Back surgery      , hard ware L3,4,5  . Anterior cervical corpectomy N/A 11/16/2012    Procedure: ANTERIOR CERVICAL CORPECTOMY C-5;  Surgeon: Eustace Moore, MD;  Location: [place] [facility];  Service: Neurosurgery;  Laterality: N/A;  Cervcial Five   Family History  Problem Relation Age of Onset  . Diabetes Mother   . Diabetes Father    Social History  Substance Use Topics  . Smoking status: Current Every Day Smoker -- 1.00 packs/day for 20 years    Types: Cigarettes  . Smokeless tobacco: Never Used  . Alcohol Use: No   OB History    Gravida Para Term Preterm AB TAB SAB Ectopic Multiple Living   4 2 2  0 2 1 1  0 0 2     Review of Systems  Constitutional: Negative for fever and chills.  HENT: Negative for sore throat and trouble swallowing.   Respiratory: Negative for cough and shortness of breath.   Cardiovascular: Positive for chest pain, palpitations and near-syncope. Negative for leg swelling.  Gastrointestinal: Positive for nausea. Negative for vomiting, abdominal pain and diarrhea.  Genitourinary: Positive for frequency. Negative for dysuria, hematuria and flank pain.  Musculoskeletal: Negative for neck pain.  Neurological: Positive for dizziness. Negative for syncope, speech difficulty, weakness,  numbness and headaches.  Psychiatric/Behavioral: Positive for hallucinations. Negative for suicidal ideas.      Allergies  Review of patient's allergies indicates no known allergies.  Home Medications   Prior to Admission medications   Medication Sig Start Date End Date Taking? Authorizing Provider  amitriptyline (ELAVIL) 75 MG tablet Take 75 mg by mouth daily. 12/15/15  Yes Historical Provider, MD  busPIRone (BUSPAR) 5 MG tablet Take 5 mg by mouth 2 (two) times daily. 12/15/15  Yes Historical Provider, MD  clonazePAM (KLONOPIN) 0.5 MG tablet Take 0.5 mg by mouth 2 (two) times daily. 11/29/15  Yes Historical Provider, MD  insulin glargine (LANTUS) 100 UNIT/ML injection Inject 0.3 mLs (30 Units total) into the skin 2 (two) times daily. Patient not taking: Reported on 12/22/2015 11/30/12   Lavon Paganini Angiulli, PA-C  LORazepam (ATIVAN) 1 MG tablet Take 1 tablet (1 mg total) by mouth every 4 (four) hours as needed for anxiety. Patient not taking: Reported on 12/22/2015 11/30/12   Lavon Paganini Angiulli, PA-C  methocarbamol (ROBAXIN) 500 MG tablet Take 1 tablet (500 mg total) by mouth every 6 (six) hours as needed. Patient not taking: Reported on 12/22/2015 11/30/12   Lavon Paganini Angiulli, PA-C  naproxen (NAPROSYN) 500 MG tablet Take 1 tablet (500 mg total) by mouth 2 (two) times daily with a meal. Patient not taking: Reported on 12/22/2015 11/30/12   Lavon Paganini Angiulli, PA-C  nicotine (NICODERM CQ - DOSED IN MG/24 HOURS) 14 mg/24hr patch 14 mg patch daily x2 weeks then 7 mg patch daily x2 weeks and stop Patient not taking: Reported on 12/22/2015 11/30/12   Lavon Paganini Angiulli, PA-C  oxyCODONE 10 MG TABS Take 1-2 tablets (10-20 mg total) by mouth every 4 (four) hours as needed (10mg  for mild pain, 15 mg for moderate pain, 20mg  for severe pain). Patient not taking: Reported on 12/22/2015 11/30/12   Lavon Paganini Angiulli, PA-C  oxyCODONE-acetaminophen (PERCOCET/ROXICET) 5-325 MG per tablet Take one or two tablets by mouth  every 4 to 6 hours as needed for pain. Patient not taking: Reported on 12/22/2015 12/18/12   Marny Lowenstein, PA-C  oxymorphone 40 MG T12A Take 40 mg by mouth every 12 (twelve) hours. Patient not taking: Reported on 12/22/2015 11/30/12   Lavon Paganini Angiulli, PA-C  sulfamethoxazole-trimethoprim (BACTRIM DS,SEPTRA DS) 800-160 MG tablet Take 1 tablet by mouth 2 (two) times daily. 12/22/15 12/29/15  Kalman Drape, PA  traMADol (ULTRAM) 50 MG tablet Take 2 tablets (100 mg total) by mouth 4 (four) times daily - after meals and at bedtime. Patient not taking: Reported on 12/22/2015 11/30/12   Lavon Paganini Angiulli, PA-C   BP 93/67 mmHg  Pulse 89  Temp(Src) 98.6 F (37 C) (Oral)  Resp 14  SpO2 94% Physical Exam  Constitutional: She appears well-developed and well-nourished. No distress.  HENT:  Head: Normocephalic and atraumatic.  Eyes: Conjunctivae are normal.  Cardiovascular: Normal rate, regular rhythm and normal heart sounds.  Exam reveals no gallop and no friction rub.   No murmur heard. Pulses:  Dorsalis pedis pulses are 2+ on the right side, and 2+ on the left side.  Pulmonary/Chest: Effort normal and breath sounds normal. No respiratory distress. She has no wheezes. She has no rales.  Abdominal: Soft. Bowel sounds are normal. She exhibits no distension. There is no tenderness. There is no rebound and no guarding.  Musculoskeletal: Normal range of motion. She exhibits no edema.  Normal gait  Neurological: She is alert. Coordination normal.  Skin: Skin is warm and dry. She is not diaphoretic.  Psychiatric: Her speech is normal. Her mood appears anxious. She is agitated. Thought content is paranoid and delusional. She expresses no homicidal and no suicidal ideation.  Nursing note and vitals reviewed.   ED Course  Procedures (including critical care time) Labs Review Labs Reviewed  BASIC METABOLIC PANEL - Abnormal; Notable for the following:    Glucose, Bld 256 (*)    All other components  within normal limits  CBC - Abnormal; Notable for the following:    RBC 5.82 (*)    Hemoglobin 17.5 (*)    HCT 50.6 (*)    All other components within normal limits  URINALYSIS, ROUTINE W REFLEX MICROSCOPIC (NOT AT [facility]) - Abnormal; Notable for the following:    Color, Urine AMBER (*)    APPearance CLOUDY (*)    Glucose, UA 250 (*)    Bilirubin Urine SMALL (*)    Ketones, ur 15 (*)    Leukocytes, UA MODERATE (*)    All other components within normal limits  URINE MICROSCOPIC-ADD ON - Abnormal; Notable for the following:    Squamous Epithelial / LPF 6-30 (*)    Bacteria, UA MANY (*)    Casts HYALINE CASTS (*)    All other components within normal limits  ACETAMINOPHEN LEVEL - Abnormal; Notable for the following:    Acetaminophen (Tylenol), Serum <10 (*)    All other components within normal limits  CBG MONITORING, ED - Abnormal; Notable for the following:    Glucose-Capillary 269 (*)    All other components within normal limits  ETHANOL  SALICYLATE LEVEL  URINE RAPID DRUG SCREEN, HOSP PERFORMED  I-STAT TROPOININ, ED  POC URINE PREG, ED    Imaging Review No results found. I have personally reviewed and evaluated these images and lab results as part of my medical decision-making.   EKG Interpretation   Date/Time:  Monday December 22 2015 01:29:28 EDT Ventricular Rate:  122 PR Interval:  140 QRS Duration: 78 QT Interval:  328 QTC Calculation: 467 R Axis:   122 Text Interpretation:  Sinus tachycardia Right axis deviation Nonspecific T  wave abnormality Abnormal ECG No old tracing to compare Confirmed by Glynn Octave [phone]) on 12/22/2015 6:49:19 AM      MDM   Final diagnoses:  Chest pain, unspecified chest pain type  UTI (lower urinary tract infection)  Dizziness   Chest pain is not likely of cardiac or pulmonary etiology d/t presentation, VSS, patient not tachycardic during my examination, no tracheal deviation, no JVD or new murmur, RRR, breath sounds  equal bilaterally, EKG without acute abnormalities, negative troponin, and negative CXR. Patient states increased anxiety due to concerns about aliens harming her. Patient's presentation is likely due to anxiety. Discussed TTS consult with patient regarding her delusions and she agreed to speak to TTS.  Patient with hemoconcentration likely due to dehydration. Patient was given fluids and the ED. patient was found have UTI. Will discharge patient with antibiotic. Other labs unremarkable.  Patient did not want to stay. She stated she had to go to the methadone clinic, her front door was unlocked and she needed to leave. I attempted to convince the patient that she needed to stay and speak with TTS and she refused. She wanted an IV antibiotic for her UTI and I informed her that PO was what would be best. Patient was very agitated and disgruntled. I convinced the patient that she needed an antibiotic for UTI and she stated she would wait for the prescription.  Patient left before discharge paperwork could be given to her. Nurse stated she would call the patient to inform her that her prescription for antibiotic for UTI was here and she can pick it up today at her convenience. Pt left against my medical advice.   Case discussed and pt seen by Dr. Winfred Leeds who agrees with the above plan.         Kalman Drape, [place] 12/22/15 [place], MD 12/22/15 1729

## 2015-12-22 NOTE — ED Notes (Signed)
Patient came out of room and reported that she wanted to leave. MD was to give her antibiotics and then she wanted to go. PA made aware and stated she would come speak with the patient.

## 2015-12-24 ENCOUNTER — Emergency Department (HOSPITAL_COMMUNITY)
Admission: EM | Admit: 2015-12-24 | Discharge: 2015-12-25 | Disposition: A | Discharge status: Home / Self Care | Payer: Medicare Other | Attending: Emergency Medicine | Admitting: Emergency Medicine

## 2015-12-24 ENCOUNTER — Encounter (HOSPITAL_COMMUNITY): Payer: Self-pay | Admitting: Emergency Medicine

## 2015-12-24 DIAGNOSIS — R030 Elevated blood-pressure reading, without diagnosis of hypertension: Secondary | ICD-10-CM | POA: Diagnosis not present

## 2015-12-24 DIAGNOSIS — G4489 Other headache syndrome: Secondary | ICD-10-CM | POA: Diagnosis not present

## 2015-12-24 DIAGNOSIS — F11251 Opioid dependence with opioid-induced psychotic disorder with hallucinations: Secondary | ICD-10-CM

## 2015-12-24 DIAGNOSIS — F329 Major depressive disorder, single episode, unspecified: Secondary | ICD-10-CM | POA: Insufficient documentation

## 2015-12-24 DIAGNOSIS — Z794 Long term (current) use of insulin: Secondary | ICD-10-CM | POA: Insufficient documentation

## 2015-12-24 DIAGNOSIS — E119 Type 2 diabetes mellitus without complications: Secondary | ICD-10-CM | POA: Diagnosis not present

## 2015-12-24 DIAGNOSIS — F22 Delusional disorders: Secondary | ICD-10-CM | POA: Diagnosis not present

## 2015-12-24 DIAGNOSIS — Z79899 Other long term (current) drug therapy: Secondary | ICD-10-CM | POA: Insufficient documentation

## 2015-12-24 DIAGNOSIS — F259 Schizoaffective disorder, unspecified: Secondary | ICD-10-CM | POA: Diagnosis not present

## 2015-12-24 DIAGNOSIS — F11259 Opioid dependence with opioid-induced psychotic disorder, unspecified: Secondary | ICD-10-CM | POA: Diagnosis present

## 2015-12-24 DIAGNOSIS — F17210 Nicotine dependence, cigarettes, uncomplicated: Secondary | ICD-10-CM | POA: Diagnosis not present

## 2015-12-24 DIAGNOSIS — R51 Headache: Secondary | ICD-10-CM | POA: Diagnosis present

## 2015-12-24 LAB — COMPREHENSIVE METABOLIC PANEL
ALK PHOS: 77 U/L (ref 38–126)
ALT: 34 U/L (ref 14–54)
ANION GAP: 9 (ref 5–15)
AST: 80 U/L — ABNORMAL HIGH (ref 15–41)
Albumin: 4.4 g/dL (ref 3.5–5.0)
BUN: 8 mg/dL (ref 6–20)
CALCIUM: 9.1 mg/dL (ref 8.9–10.3)
CO2: 27 mmol/L (ref 22–32)
CREATININE: 0.72 mg/dL (ref 0.44–1.00)
Chloride: 104 mmol/L (ref 101–111)
Glucose, Bld: 151 mg/dL — ABNORMAL HIGH (ref 65–99)
Potassium: 3.8 mmol/L (ref 3.5–5.1)
Sodium: 140 mmol/L (ref 135–145)
Total Bilirubin: 0.8 mg/dL (ref 0.3–1.2)
Total Protein: 8 g/dL (ref 6.5–8.1)

## 2015-12-24 LAB — URINALYSIS, ROUTINE W REFLEX MICROSCOPIC
BILIRUBIN URINE: NEGATIVE
GLUCOSE, UA: NEGATIVE mg/dL
HGB URINE DIPSTICK: NEGATIVE
KETONES UR: NEGATIVE mg/dL
NITRITE: NEGATIVE
PH: 6 (ref 5.0–8.0)
Protein, ur: NEGATIVE mg/dL
SPECIFIC GRAVITY, URINE: 1.02 (ref 1.005–1.030)

## 2015-12-24 LAB — CBC WITH DIFFERENTIAL/PLATELET
BASOS ABS: 0.1 10*3/uL (ref 0.0–0.1)
BASOS PCT: 1 %
EOS ABS: 0.1 10*3/uL (ref 0.0–0.7)
EOS PCT: 1 %
HCT: 48.4 % — ABNORMAL HIGH (ref 36.0–46.0)
Hemoglobin: 17.3 g/dL — ABNORMAL HIGH (ref 12.0–15.0)
Lymphocytes Relative: 33 %
Lymphs Abs: 3.3 10*3/uL (ref 0.7–4.0)
MCH: 30.6 pg (ref 26.0–34.0)
MCHC: 35.7 g/dL (ref 30.0–36.0)
MCV: 85.7 fL (ref 78.0–100.0)
Monocytes Absolute: 0.5 10*3/uL (ref 0.1–1.0)
Monocytes Relative: 5 %
NEUTROS PCT: 60 %
Neutro Abs: 6.1 10*3/uL (ref 1.7–7.7)
PLATELETS: 180 10*3/uL (ref 150–400)
RBC: 5.65 MIL/uL — AB (ref 3.87–5.11)
RDW: 13.7 % (ref 11.5–15.5)
WBC: 10.1 10*3/uL (ref 4.0–10.5)

## 2015-12-24 LAB — URINE MICROSCOPIC-ADD ON

## 2015-12-24 LAB — SALICYLATE LEVEL

## 2015-12-24 LAB — PREGNANCY, URINE: PREG TEST UR: NEGATIVE

## 2015-12-24 LAB — RAPID URINE DRUG SCREEN, HOSP PERFORMED
AMPHETAMINES: NOT DETECTED
BENZODIAZEPINES: NOT DETECTED
Barbiturates: NOT DETECTED
COCAINE: NOT DETECTED
OPIATES: NOT DETECTED
Tetrahydrocannabinol: NOT DETECTED

## 2015-12-24 LAB — ETHANOL

## 2015-12-24 LAB — ACETAMINOPHEN LEVEL

## 2015-12-24 MED ORDER — KETOROLAC TROMETHAMINE 30 MG/ML IJ SOLN
30.0000 mg | Freq: Once | INTRAMUSCULAR | Status: AC
  Administered 2015-12-24: 30 mg via INTRAVENOUS
  Filled 2015-12-24: qty 1

## 2015-12-24 MED ORDER — SULFAMETHOXAZOLE-TRIMETHOPRIM 800-160 MG PO TABS
1.0000 | ORAL_TABLET | Freq: Two times a day (BID) | ORAL | Status: DC
  Administered 2015-12-25: 1 via ORAL
  Filled 2015-12-24: qty 1

## 2015-12-24 MED ORDER — METOCLOPRAMIDE HCL 5 MG/ML IJ SOLN
10.0000 mg | Freq: Once | INTRAMUSCULAR | Status: AC
  Administered 2015-12-24: 10 mg via INTRAVENOUS
  Filled 2015-12-24: qty 2

## 2015-12-24 MED ORDER — AMITRIPTYLINE HCL 25 MG PO TABS
75.0000 mg | ORAL_TABLET | Freq: Every day | ORAL | Status: DC
  Filled 2015-12-24: qty 3

## 2015-12-24 MED ORDER — BUSPIRONE HCL 10 MG PO TABS
5.0000 mg | ORAL_TABLET | Freq: Two times a day (BID) | ORAL | Status: DC
  Administered 2015-12-24 – 2015-12-25 (×2): 5 mg via ORAL
  Filled 2015-12-24 (×2): qty 1

## 2015-12-24 MED ORDER — SODIUM CHLORIDE 0.9 % IV BOLUS (SEPSIS)
1000.0000 mL | Freq: Once | INTRAVENOUS | Status: AC
  Administered 2015-12-24: 1000 mL via INTRAVENOUS

## 2015-12-24 MED ORDER — CLONAZEPAM 0.5 MG PO TABS
0.5000 mg | ORAL_TABLET | Freq: Two times a day (BID) | ORAL | Status: DC
  Administered 2015-12-24: 0.5 mg via ORAL
  Filled 2015-12-24: qty 1

## 2015-12-24 MED ORDER — INSULIN GLARGINE 100 UNIT/ML ~~LOC~~ SOLN
30.0000 [IU] | Freq: Two times a day (BID) | SUBCUTANEOUS | Status: DC
  Filled 2015-12-24 (×2): qty 0.3

## 2015-12-24 MED ORDER — AMITRIPTYLINE HCL 25 MG PO TABS
75.0000 mg | ORAL_TABLET | Freq: Every day | ORAL | Status: DC
  Administered 2015-12-24: 75 mg via ORAL

## 2015-12-24 MED ORDER — SULFAMETHOXAZOLE-TRIMETHOPRIM 800-160 MG PO TABS
1.0000 | ORAL_TABLET | Freq: Once | ORAL | Status: AC
  Administered 2015-12-24: 1 via ORAL
  Filled 2015-12-24 (×2): qty 1

## 2015-12-24 NOTE — ED Notes (Addendum)
Patient presents from home via ems for headache x45 minutes. History of migraine. Per EMS no Neuro deficits.  Last VS: 160/102, 96hr, 98%, cbg 183

## 2015-12-24 NOTE — BH Assessment (Signed)
[facility] Assessment Progress Note  Case was staffed with Reita Cliche DNP who recommended patient be observed over night and re-evaluated in the a.m. EDP was informed of disposition and concurred.

## 2015-12-24 NOTE — ED Notes (Signed)
Pt dressed out and wanded by security.

## 2015-12-24 NOTE — ED Provider Notes (Signed)
CSN: [id number]     Arrival date & time 12/24/15  1554 History   First MD Initiated Contact with Patient 12/24/15 1730     Chief Complaint  Patient presents with  . Headache    HPI Comments: 40 year old female who presents with headache and delusions/psychosis. PMH significant for anxiety/depression, past IVDU, schizophrenia, and diabetes. She was seen at [facility] 2 days ago for chest pain and delusional thinking but left AMA before TTS could talk to her. She denies SI or HI at that time. Today she states she had an acute onset of a frontal headache. She called EMS within 15 minutes of the headache. She states she is also "seeing rainbows" in her vision. Denies N/V. She is also requesting a mental health eval. She states that on and off for the past 4 years she has been dealing with delusional thoughts. At first she thought that her boyfriend was poisoning her. Now she believes that the devil is being disguised as UFOs and doing experiments on her. She denies fever, chills, chest pain, SOB, abdominal pain, N/V/D, dysuria. She denies recent drug use. She is not on any antipsychotic medicines and does not have a therapist. She states she does have a PCP but has not told them about her thoughts. She denies SI however she states she has been burning herself with cigarettes because the UFOs make her. Denies HI or harm by others.   Patient is a 40 y.o. female presenting with headaches.  Headache Associated symptoms: no abdominal pain, no diarrhea, no fever, no nausea and no vomiting     Past Medical History  Diagnosis Date  . Anxiety   . Depression   . Infection     hepatitis c  . Schizophrenia ([place])   . Substance abuse   . Pneumonia   . Carpal tunnel syndrome   . Hepatitis C   . Diabetes mellitus without complication [facility])    Past Surgical History  Procedure Laterality Date  . Cholecystectomy    . Back surgery      , hard ware L3,4,5  . Anterior cervical corpectomy N/A 11/16/2012    Procedure:  ANTERIOR CERVICAL CORPECTOMY C-5;  Surgeon: Eustace Moore, MD;  Location: [place] [facility];  Service: Neurosurgery;  Laterality: N/A;  Cervcial Five   Family History  Problem Relation Age of Onset  . Diabetes Mother   . Diabetes Father    Social History  Substance Use Topics  . Smoking status: Current Every Day Smoker -- 1.00 packs/day for 20 years    Types: Cigarettes  . Smokeless tobacco: Never Used  . Alcohol Use: No   OB History    Gravida Para Term Preterm AB TAB SAB Ectopic Multiple Living   4 2 2  0 2 1 1  0 0 2     Review of Systems  Constitutional: Negative for fever.  Respiratory: Negative for shortness of breath.   Cardiovascular: Negative for chest pain.  Gastrointestinal: Negative for nausea, vomiting, abdominal pain and diarrhea.  Genitourinary: Negative for dysuria.  Neurological: Positive for headaches.  Psychiatric/Behavioral: Positive for hallucinations and self-injury. Negative for suicidal ideas. The patient is nervous/anxious.   All other systems reviewed and are negative.   Allergies  Review of patient's allergies indicates no known allergies.  Home Medications   Prior to Admission medications   Medication Sig Start Date End Date Taking? Authorizing Provider  amitriptyline (ELAVIL) 75 MG tablet Take 75 mg by mouth daily. 12/15/15   Historical  Provider, MD  busPIRone (BUSPAR) 5 MG tablet Take 5 mg by mouth 2 (two) times daily. 12/15/15   Historical Provider, MD  clonazePAM (KLONOPIN) 0.5 MG tablet Take 0.5 mg by mouth 2 (two) times daily. 11/29/15   Historical Provider, MD  insulin glargine (LANTUS) 100 UNIT/ML injection Inject 0.3 mLs (30 Units total) into the skin 2 (two) times daily. Patient not taking: Reported on 12/22/2015 11/30/12   Lavon Paganini Angiulli, PA-C  LORazepam (ATIVAN) 1 MG tablet Take 1 tablet (1 mg total) by mouth every 4 (four) hours as needed for anxiety. Patient not taking: Reported on 12/22/2015 11/30/12   Lavon Paganini Angiulli, PA-C  methocarbamol  (ROBAXIN) 500 MG tablet Take 1 tablet (500 mg total) by mouth every 6 (six) hours as needed. Patient not taking: Reported on 12/22/2015 11/30/12   Lavon Paganini Angiulli, PA-C  naproxen (NAPROSYN) 500 MG tablet Take 1 tablet (500 mg total) by mouth 2 (two) times daily with a meal. Patient not taking: Reported on 12/22/2015 11/30/12   Lavon Paganini Angiulli, PA-C  nicotine (NICODERM CQ - DOSED IN MG/24 HOURS) 14 mg/24hr patch 14 mg patch daily x2 weeks then 7 mg patch daily x2 weeks and stop Patient not taking: Reported on 12/22/2015 11/30/12   Lavon Paganini Angiulli, PA-C  oxyCODONE 10 MG TABS Take 1-2 tablets (10-20 mg total) by mouth every 4 (four) hours as needed (10mg  for mild pain, 15 mg for moderate pain, 20mg  for severe pain). Patient not taking: Reported on 12/22/2015 11/30/12   Lavon Paganini Angiulli, PA-C  oxyCODONE-acetaminophen (PERCOCET/ROXICET) 5-325 MG per tablet Take one or two tablets by mouth every 4 to 6 hours as needed for pain. Patient not taking: Reported on 12/22/2015 12/18/12   Marny Lowenstein, PA-C  oxymorphone 40 MG T12A Take 40 mg by mouth every 12 (twelve) hours. Patient not taking: Reported on 12/22/2015 11/30/12   Lavon Paganini Angiulli, PA-C  sulfamethoxazole-trimethoprim (BACTRIM DS,SEPTRA DS) 800-160 MG tablet Take 1 tablet by mouth 2 (two) times daily. 12/22/15 12/29/15  Kalman Drape, PA  traMADol (ULTRAM) 50 MG tablet Take 2 tablets (100 mg total) by mouth 4 (four) times daily - after meals and at bedtime. Patient not taking: Reported on 12/22/2015 11/30/12   Lavon Paganini Angiulli, PA-C   BP 98/83 mmHg  Pulse 83  Temp(Src) 98.2 F (36.8 C) (Oral)  Resp 18  Ht 5\' 8"  (1.727 m)  Wt 108.863 kg  BMI 36.50 kg/m2  SpO2 95%  LMP    Physical Exam  Constitutional: She is oriented to person, place, and time. She appears well-developed and well-nourished. No distress.  HENT:  Head: Normocephalic and atraumatic.  Mouth/Throat: Abnormal dentition.  Eyes: Conjunctivae are normal. Pupils are equal, round,  and reactive to light. Right eye exhibits no discharge. Left eye exhibits no discharge. No scleral icterus.  Neck: Normal range of motion.  Cardiovascular: Normal rate and regular rhythm.  Exam reveals no gallop and no friction rub.   No murmur heard. Pulmonary/Chest: Effort normal and breath sounds normal. No respiratory distress. She has no wheezes. She has no rales. She exhibits no tenderness.  Abdominal: Soft. Bowel sounds are normal. She exhibits no distension. There is no tenderness.  Neurological: She is alert and oriented to person, place, and time.  Mental Status:  Alert, oriented, thought content appropriate, able to give a coherent history. Speech fluent without evidence of aphasia. Able to follow 2 step commands without difficulty.  Cranial Nerves:  II:  Peripheral visual fields  grossly normal, pupils equal, round, reactive to light III,IV, VI: ptosis not present, extra-ocular motions intact bilaterally  V,VII: smile symmetric, facial light touch sensation equal VIII: hearing grossly normal to voice  X: uvula elevates symmetrically  XI: bilateral shoulder shrug symmetric and strong XII: midline tongue extension without fassiculations Gait: normal gait and balance    Skin: Skin is warm and dry.  Multiple scars and scabs   Psychiatric: Her speech is normal and behavior is normal. Her mood appears anxious. Thought content is paranoid and delusional. She expresses no homicidal and no suicidal ideation. She expresses no suicidal plans and no homicidal plans.    ED Course  Procedures (including critical care time) Labs Review Labs Reviewed  COMPREHENSIVE METABOLIC PANEL - Abnormal; Notable for the following:    Glucose, Bld 151 (*)    AST 80 (*)    All other components within normal limits  CBC WITH DIFFERENTIAL/PLATELET - Abnormal; Notable for the following:    RBC 5.65 (*)    Hemoglobin 17.3 (*)    HCT 48.4 (*)    All other components within normal limits  URINALYSIS,  ROUTINE W REFLEX MICROSCOPIC (NOT AT [facility]) - Abnormal; Notable for the following:    Color, Urine AMBER (*)    APPearance CLOUDY (*)    Leukocytes, UA SMALL (*)    All other components within normal limits  ACETAMINOPHEN LEVEL - Abnormal; Notable for the following:    Acetaminophen (Tylenol), Serum <10 (*)    All other components within normal limits  URINE MICROSCOPIC-ADD ON - Abnormal; Notable for the following:    Squamous Epithelial / LPF 6-30 (*)    Bacteria, UA MANY (*)    All other components within normal limits  URINE CULTURE  ETHANOL  URINE RAPID DRUG SCREEN, HOSP PERFORMED  SALICYLATE LEVEL  PREGNANCY, URINE    Imaging Review No results found. I have personally reviewed and evaluated these images and lab results as part of my medical decision-making.   EKG Interpretation None      MDM   Final diagnoses:  Delusional disorder ([place])   40 year old female who presents with headache and delusional thoughts which have been ongoing. No red flag headache signs/symptoms. After treatment with migraine cocktail, patient reports her headache is better.  Vital signs reviewed. She is afebrile, not tachycardic or tachypneic, and not hypoxic. She is slightly hypotensive. CBC is unremarkable, CMP remarkable for hyperglycemia. Patient is a diabetic. Her AST is slightly elevated. ETOH normal. UDS normal. ASA and Tylenol levels normal. UA remarkable for small leukocytes with many bacteria. Appears improved since 2 days ago but patient still reports feeling asymptomatic. Bactrim given.   TTS consulted for delusional thoughts and paranoia. They recommend having her observed overnight and official psych eval in the morning. Patient is voluntary at this time. IVC paperwork filled out as a precautionary measure as I do feel she requires treatment even though she denies overt SI/HI. Holding orders placed.  Recardo Evangelist, PA-C 12/24/15 2122  Dorie Rank, MD 12/24/15 2207

## 2015-12-24 NOTE — BH Assessment (Addendum)
Assessment Note  Veronica Thomas is an 40 y.o. female that presents this date per notes, for ongoing delusions, psychosis and burning self with cigarettes due to "UFO's". Patient answered the first few questions of assessment and then stated "you are one of them" and refused to answer anymore questions. Patient did denied HI, SI or current SA use but stated "you don't understand the demons." Patient stated she received medications from [facility] in [place], [place] but could not elaborate on what they were for or how long she has been seeing that provider. Patient denied any MH diagnoses. Completion of assessment was obtained from admission notes: Patient did agree to voluntary admission stating "yes, yes, I need to be here to stay away from them." Admission notes stated: "Patient reports she's having panic attacks and is speaking of hearing aliens. She will not elaborate further history to me. She denies wanting to harm herself or anyone else. She reports that she saw a psychiatrist many years ago presently is prescribed Elavil and Klonopin by her primary care physician [place]. Patient declines hospitalization and does not wish to talk to TTS." [name] noted: "Patient is a 40 year old female with a history of anxiety, depression, schizophrenia, past IVDU, diabetes who presents the ED with left-sided chest pressure, left arm pain that began around midnight and lasted for roughly 10-15 minutes. Associated dizziness, heart palpitations, nausea, sweating. Patient denies shortness of breath, headache, syncope, abdominal pain, vomiting. Patient does endorse increased urinary frequency but denies dysuria or hematuria. Patient stated in triage she was scared to be alone. When I asked why, patient states that she feels for the last 4 years she is being hurt by extraterrestrials. She states she initially thought her boyfriend was trying to poison her. She states she pulled a tentacle out of her arm roughly 3 years ago and the  alien is still in her body. She states in the past week these encounters have gotten worse. She states the aliens make cuts on her body and give her shots but do not leave marks. She states the aliens are clear and look likel squids and you can only see them in the shadows. She states "they're trying to kill me" and "the government is involved" patient denies suicidal or homicidal ideations. She states tonight she felt the aliens were trying to put her to sleep to do things to her when she realized it, woke up and went outstide and began to experience chest pain". Case was staffed with Reita Cliche DNP who recommended patient be observed over night and re-evaluated in the a.m. EDP was informed of disposition and concurred.   Diagnosis: Schizophrenia with psychosis (per notes)  Past Medical History:  Past Medical History  Diagnosis Date  . Anxiety   . Depression   . Infection     hepatitis c  . Schizophrenia ([place])   . Substance abuse   . Pneumonia   . Carpal tunnel syndrome   . Hepatitis C   . Diabetes mellitus without complication [facility])     Past Surgical History  Procedure Laterality Date  . Cholecystectomy    . Back surgery      , hard ware L3,4,5  . Anterior cervical corpectomy N/A 11/16/2012    Procedure: ANTERIOR CERVICAL CORPECTOMY C-5;  Surgeon: Eustace Moore, MD;  Location: [facility];  Service: Neurosurgery;  Laterality: N/A;  Cervcial Five    Family History:  Family History  Problem Relation Age of Onset  . Diabetes Mother   .  Diabetes Father     Social History:  reports that she has been smoking Cigarettes.  She has a 20 pack-year smoking history. She has never used smokeless tobacco. She reports that she does not drink alcohol or use illicit drugs.  Additional Social History:  Alcohol / Drug Use Pain Medications: See MAR Prescriptions: See MAR Over the Counter: See MAR History of alcohol / drug use?: No history of alcohol / drug abuse (no current use)  CIWA:  CIWA-Ar BP: 98/83 mmHg Pulse Rate: 83 COWS:    Allergies: No Known Allergies  Home Medications:  (Not in a hospital admission)  OB/GYN Status:  No LMP recorded. Patient is not currently having periods (Reason: Other).  General Assessment Data Location of Assessment: [facility] TTS Assessment: In system Is this a Tele or Face-to-Face Assessment?: Face-to-Face Is this an Initial Assessment or a Re-assessment for this encounter?: Initial Assessment Marital status: Single Maiden name: na Is patient pregnant?: No Pregnancy Status: No Living Arrangements: Alone Can pt return to current living arrangement?: Yes Admission Status: Voluntary Is patient capable of signing voluntary admission?: Yes Referral Source: Self/Family/Friend Insurance type: Medicaid  Medical Screening Exam ([place]) Medical Exam completed: Yes  Crisis Care Plan Living Arrangements: Alone Legal Guardian: Other: (na) Name of Psychiatrist: Vanike MD Name of Therapist: None  Education Status Is patient currently in school?: No Current Grade: na Highest grade of school patient has completed: 5 Name of school: na Contact person: na  Risk to self with the past 6 months Suicidal Ideation: No Has patient been a risk to self within the past 6 months prior to admission? : No Suicidal Intent: No Has patient had any suicidal intent within the past 6 months prior to admission? : No Is patient at risk for suicide?: No Suicidal Plan?: No Has patient had any suicidal plan within the past 6 months prior to admission? : No Access to Means: No What has been your use of drugs/alcohol within the last 12 months?: Denies Previous Attempts/Gestures: No How many times?: 0 Other Self Harm Risks: none Triggers for Past Attempts: Unknown Intentional Self Injurious Behavior: None Family Suicide History: No Recent stressful life event(s): Other (Comment) (none) Persecutory voices/beliefs?:  (UTA) Depression:   (UTA) Depression Symptoms:  (UTA) Substance abuse history and/or treatment for substance abuse?:  (UTA) Suicide prevention information given to non-admitted patients: Not applicable  Risk to Others within the past 6 months Homicidal Ideation:  (UTA) Does patient have any lifetime risk of violence toward others beyond the six months prior to admission? : Unknown Thoughts of Harm to Others:  (UTA) Current Homicidal Intent:  (UTA) Current Homicidal Plan:  (UTA) Access to Homicidal Means:  (UTA) Identified Victim:  (UTA) History of harm to others?:  (UTA) Assessment of Violence: None Noted Violent Behavior Description:  (NA) Does patient have access to weapons?:  ([place]) Criminal Charges Pending?:  (UTA) Does patient have a court date:  (UTA) Is patient on probation?: Unknown  Psychosis Hallucinations:  (UTA) Delusions:  (UTA)  Mental Status Report Appearance/Hygiene: Disheveled Eye Contact: Poor Motor Activity: Freedom of movement Speech: Aggressive, Argumentative Level of Consciousness: Alert Mood: Anxious Affect: Apprehensive Anxiety Level: Moderate Thought Processes: Irrelevant Judgement: Impaired Orientation:  (UTA) Obsessive Compulsive Thoughts/Behaviors: Unable to Assess  Cognitive Functioning Concentration: Unable to Assess Memory: Unable to Assess IQ:  (UTA) Insight: Unable to Assess Impulse Control: Unable to Assess Appetite:  (UTA) Weight Loss:  (UTA) Weight Gain:  (UTA) Sleep: Unable to Assess Total  Hours of Sleep:  (UTA) Vegetative Symptoms: Unable to Assess  ADLScreening [facility]) Patient's cognitive ability adequate to safely complete daily activities?: No Patient able to express need for assistance with ADLs?: Yes Independently performs ADLs?: Yes (appropriate for developmental age)  Prior Inpatient Therapy Prior Inpatient Therapy:  (UTA) Prior Therapy Dates:  (UTA) Prior Therapy Facilty/Provider(s):  (UTA) Reason for Treatment:   (UTA)  Prior Outpatient Therapy Prior Outpatient Therapy:  (UTA) Prior Therapy Dates:  (UTA) Prior Therapy Facilty/Provider(s):  (UTA) Reason for Treatment:  (UTA) Does patient have an ACCT team?: Unknown Does patient have Intensive In-House Services?  : Unknown Does patient have [facility] services? : Unknown Does patient have P4CC services?: Unknown  ADL Screening (condition at time of admission) Patient's cognitive ability adequate to safely complete daily activities?: No Is the patient deaf or have difficulty hearing?: No Does the patient have difficulty seeing, even when wearing glasses/contacts?: No Does the patient have difficulty concentrating, remembering, or making decisions?: No Patient able to express need for assistance with ADLs?: Yes Does the patient have difficulty dressing or bathing?: No Independently performs ADLs?: Yes (appropriate for developmental age) Does the patient have difficulty walking or climbing stairs?: No Weakness of Legs: None Weakness of Arms/Hands: None  Home Assistive Devices/Equipment Home Assistive Devices/Equipment: None  Therapy Consults (therapy consults require a physician order) PT Evaluation Needed: No OT Evalulation Needed: No SLP Evaluation Needed: No Abuse/Neglect Assessment (Assessment to be complete while patient is alone) Physical Abuse: Denies Verbal Abuse: Denies Sexual Abuse: Denies Exploitation of patient/patient's resources: Denies Self-Neglect: Denies Values / Beliefs Cultural Requests During Hospitalization: None Spiritual Requests During Hospitalization: None Consults Spiritual Care Consult Needed: No Social Work Consult Needed: No Regulatory affairs officer (For Healthcare) Does patient have an advance directive?: No Would patient like information on creating an advanced directive?: No - patient declined information (pt declines information)    Additional Information 1:1 In Past 12 Months?:  (UTA) CIRT Risk:  No Elopement Risk: No Does patient have medical clearance?: No     Disposition: Case was staffed with Reita Cliche DNP who recommended patient be observed over night and re-evaluated in the a.m. Disposition Initial Assessment Completed for this Encounter: Yes Disposition of Patient: Other dispositions (re-evaluate in the a.m.) Other disposition(s): Other (Comment) (re-evauate in the a.m.)  On Site Evaluation by:   Reviewed with Physician:    Mamie Nick 12/24/2015 6:39 PM

## 2015-12-24 NOTE — ED Notes (Signed)
As I leave her room from having given her meds, our TTS evaluator, Shanon Brow enters her room.

## 2015-12-24 NOTE — ED Notes (Signed)
Patient is also requesting help from  a treatment center for mental health.  She denies any thoughts of harming herself

## 2015-12-24 NOTE — ED Notes (Signed)
Patient is requesting pain medication and antibiotics

## 2015-12-25 DIAGNOSIS — F11251 Opioid dependence with opioid-induced psychotic disorder with hallucinations: Secondary | ICD-10-CM

## 2015-12-25 DIAGNOSIS — F22 Delusional disorders: Secondary | ICD-10-CM | POA: Diagnosis not present

## 2015-12-25 DIAGNOSIS — F11259 Opioid dependence with opioid-induced psychotic disorder, unspecified: Secondary | ICD-10-CM | POA: Diagnosis present

## 2015-12-25 LAB — URINE CULTURE

## 2015-12-25 MED ORDER — SULFAMETHOXAZOLE-TRIMETHOPRIM 800-160 MG PO TABS: 1.0000 | ORAL_TABLET | Freq: Two times a day (BID) | ORAL | Status: AC

## 2015-12-25 MED ORDER — NYSTATIN 100000 UNIT/GM EX POWD: Freq: Two times a day (BID) | CUTANEOUS | Status: AC

## 2015-12-25 MED ORDER — DIPHENHYDRAMINE HCL 25 MG PO CAPS
50.0000 mg | ORAL_CAPSULE | Freq: Once | ORAL | Status: AC
  Administered 2015-12-25: 50 mg via ORAL
  Filled 2015-12-25: qty 2

## 2015-12-25 MED ORDER — PHENAZOPYRIDINE HCL 100 MG PO TABS: 100.0000 mg | ORAL_TABLET | Freq: Three times a day (TID) | ORAL | Status: AC

## 2015-12-25 MED ORDER — FLUCONAZOLE 150 MG PO TABS
150.0000 mg | ORAL_TABLET | Freq: Once | ORAL | Status: AC
Start: 2015-12-25 — End: 2015-12-25
  Administered 2015-12-25: 150 mg via ORAL
  Filled 2015-12-25: qty 1

## 2015-12-25 MED ORDER — PHENAZOPYRIDINE HCL 100 MG PO TABS
100.0000 mg | ORAL_TABLET | Freq: Three times a day (TID) | ORAL | Status: DC
  Administered 2015-12-25: 100 mg via ORAL
  Filled 2015-12-25: qty 1

## 2015-12-25 MED ORDER — NYSTATIN 100000 UNIT/GM EX POWD
Freq: Two times a day (BID) | CUTANEOUS | Status: DC
  Administered 2015-12-25: 12:00:00 via TOPICAL
  Filled 2015-12-25: qty 15

## 2015-12-25 NOTE — ED Notes (Signed)
Pt refused to allow staff to check her CBG. She also refuses Lantus.

## 2015-12-25 NOTE — BH Assessment (Signed)
[place] Assessment Progress Note  Per Corena Pilgrim, MD, this pt does not require psychiatric hospitalization at this time.  Pt is to be discharged from [facility] with area substance abuse treatment referrals.  Discharge instructions advise pt to follow up with [facility] or [facility] for residential treatment, or [facility] or the [facility] for outpatient treatment.  Pt's nurse, Diane, has been notified.  Jalene Mullet, [place] Triage Specialist [phone]

## 2015-12-25 NOTE — ED Notes (Signed)
Pt is anxious and attention seeking. 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

## 2015-12-25 NOTE — Progress Notes (Signed)
D Pt. Denies SI and HI, no complaints of pain or discomfort noted at present time.  A Writer offered support and encouragement, discussed reason for admission with pt.  R Pt. Reports that she lives alone and admits she gets scared and very lonely.  Pt. Lives in an apartment,  Denies any issues with her neighbors, stating" I get along with them okay, they get on my nerves but we get along" Pt. Also reports she needs a medication adjustment.  Pt. Remains safe on the unit and is currently resting quietly.
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
## 2015-12-25 NOTE — ED Notes (Signed)
Pt discharged to home. She denied SI/HI, AVH. Did not appear to be responding to internal stimuli and was not delusional. All belongings returned to pt including medications that had been kept in pharmacy safe: Pt signed for same.
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
## 2015-12-25 NOTE — Discharge Instructions (Addendum)
To help you maintain a sober lifestyle, a substance abuse treatment program may be beneficial to you.  Contact one of the following providers at your earliest opportunity to ask about enrolling in their program:  RESIDENTIAL PROGRAMS:       [facility]      [place], [place] [address]      [phone]      [place], [place] [address]      [phone]   [facility]      [place], [place] [address]      [phone]             [facility]      [address] [place], [place] [address]      [phone]

## 2016-01-02 ENCOUNTER — Emergency Department (HOSPITAL_COMMUNITY)
Admission: EM | Admit: 2016-01-02 | Discharge: 2016-01-03 | Disposition: A | Discharge status: Home / Self Care | Payer: Medicare Other | Attending: Emergency Medicine | Admitting: Emergency Medicine

## 2016-01-02 ENCOUNTER — Emergency Department (HOSPITAL_COMMUNITY): Payer: Medicare Other

## 2016-01-02 ENCOUNTER — Encounter (HOSPITAL_COMMUNITY): Payer: Self-pay | Admitting: Emergency Medicine

## 2016-01-02 DIAGNOSIS — M25571 Pain in right ankle and joints of right foot: Secondary | ICD-10-CM | POA: Diagnosis not present

## 2016-01-02 DIAGNOSIS — M25531 Pain in right wrist: Secondary | ICD-10-CM | POA: Diagnosis not present

## 2016-01-02 DIAGNOSIS — M25561 Pain in right knee: Secondary | ICD-10-CM | POA: Diagnosis not present

## 2016-01-02 DIAGNOSIS — F17210 Nicotine dependence, cigarettes, uncomplicated: Secondary | ICD-10-CM | POA: Diagnosis not present

## 2016-01-02 DIAGNOSIS — Y999 Unspecified external cause status: Secondary | ICD-10-CM | POA: Diagnosis not present

## 2016-01-02 DIAGNOSIS — W010XXA Fall on same level from slipping, tripping and stumbling without subsequent striking against object, initial encounter: Secondary | ICD-10-CM | POA: Diagnosis not present

## 2016-01-02 DIAGNOSIS — Y9301 Activity, walking, marching and hiking: Secondary | ICD-10-CM | POA: Insufficient documentation

## 2016-01-02 DIAGNOSIS — Y929 Unspecified place or not applicable: Secondary | ICD-10-CM | POA: Diagnosis not present

## 2016-01-02 DIAGNOSIS — E119 Type 2 diabetes mellitus without complications: Secondary | ICD-10-CM | POA: Diagnosis not present

## 2016-01-02 DIAGNOSIS — S93402A Sprain of unspecified ligament of left ankle, initial encounter: Secondary | ICD-10-CM | POA: Diagnosis not present

## 2016-01-02 DIAGNOSIS — R000 Tachycardia, unspecified: Secondary | ICD-10-CM | POA: Diagnosis not present

## 2016-01-02 DIAGNOSIS — S99911A Unspecified injury of right ankle, initial encounter: Secondary | ICD-10-CM | POA: Diagnosis not present

## 2016-01-02 DIAGNOSIS — S80211A Abrasion, right knee, initial encounter: Secondary | ICD-10-CM | POA: Diagnosis not present

## 2016-01-02 DIAGNOSIS — M7989 Other specified soft tissue disorders: Secondary | ICD-10-CM | POA: Diagnosis not present

## 2016-01-02 DIAGNOSIS — Z794 Long term (current) use of insulin: Secondary | ICD-10-CM | POA: Insufficient documentation

## 2016-01-02 DIAGNOSIS — S99912A Unspecified injury of left ankle, initial encounter: Secondary | ICD-10-CM | POA: Diagnosis present

## 2016-01-02 LAB — CBC WITH DIFFERENTIAL/PLATELET
BASOS PCT: 1 %
Basophils Absolute: 0.1 10*3/uL (ref 0.0–0.1)
EOS ABS: 0.1 10*3/uL (ref 0.0–0.7)
Eosinophils Relative: 1 %
HCT: 50.4 % — ABNORMAL HIGH (ref 36.0–46.0)
HEMOGLOBIN: 17.7 g/dL — AB (ref 12.0–15.0)
Lymphocytes Relative: 38 %
Lymphs Abs: 3.5 10*3/uL (ref 0.7–4.0)
MCH: 30.2 pg (ref 26.0–34.0)
MCHC: 35.1 g/dL (ref 30.0–36.0)
MCV: 85.9 fL (ref 78.0–100.0)
Monocytes Absolute: 0.5 10*3/uL (ref 0.1–1.0)
Monocytes Relative: 5 %
NEUTROS PCT: 55 %
Neutro Abs: 5.2 10*3/uL (ref 1.7–7.7)
Platelets: 183 10*3/uL (ref 150–400)
RBC: 5.87 MIL/uL — AB (ref 3.87–5.11)
RDW: 13.5 % (ref 11.5–15.5)
WBC: 9.3 10*3/uL (ref 4.0–10.5)

## 2016-01-02 LAB — BASIC METABOLIC PANEL
Anion gap: 9 (ref 5–15)
BUN: 13 mg/dL (ref 6–20)
CHLORIDE: 99 mmol/L — AB (ref 101–111)
CO2: 24 mmol/L (ref 22–32)
CREATININE: 0.92 mg/dL (ref 0.44–1.00)
Calcium: 9.6 mg/dL (ref 8.9–10.3)
Glucose, Bld: 430 mg/dL — ABNORMAL HIGH (ref 65–99)
POTASSIUM: 4.8 mmol/L (ref 3.5–5.1)
SODIUM: 132 mmol/L — AB (ref 135–145)

## 2016-01-02 MED ORDER — SODIUM CHLORIDE 0.9 % IV BOLUS (SEPSIS)
1000.0000 mL | Freq: Once | INTRAVENOUS | Status: AC
  Administered 2016-01-02: 1000 mL via INTRAVENOUS

## 2016-01-02 MED ORDER — IBUPROFEN 400 MG PO TABS
600.0000 mg | ORAL_TABLET | Freq: Once | ORAL | Status: DC
  Filled 2016-01-02: qty 1

## 2016-01-02 MED ORDER — HYDROCODONE-ACETAMINOPHEN 5-325 MG PO TABS
1.0000 | ORAL_TABLET | Freq: Once | ORAL | Status: AC
  Administered 2016-01-02: 1 via ORAL
  Filled 2016-01-02: qty 1

## 2016-01-02 NOTE — ED Notes (Signed)
RN Mortimer Fries informed of pt Pulse

## 2016-01-02 NOTE — ED Triage Notes (Signed)
Pt. slipped and fell while going down the  stairs this afternoon , denies LOC /ambulatoiry , reports pain at left ankle and right knee with mild swelling/superficial abrasion . HR = 130 - 140 /min at arrival . Denies chest pain or SOB .
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
## 2016-01-03 DIAGNOSIS — S93402A Sprain of unspecified ligament of left ankle, initial encounter: Secondary | ICD-10-CM | POA: Diagnosis not present

## 2016-01-09 DIAGNOSIS — F411 Generalized anxiety disorder: Secondary | ICD-10-CM | POA: Diagnosis not present

## 2016-01-09 DIAGNOSIS — Z5181 Encounter for therapeutic drug level monitoring: Secondary | ICD-10-CM | POA: Diagnosis not present

## 2016-01-09 DIAGNOSIS — F330 Major depressive disorder, recurrent, mild: Secondary | ICD-10-CM | POA: Diagnosis not present

## 2016-01-09 DIAGNOSIS — Z79899 Other long term (current) drug therapy: Secondary | ICD-10-CM | POA: Diagnosis not present

## 2016-01-23 DIAGNOSIS — R945 Abnormal results of liver function studies: Secondary | ICD-10-CM | POA: Diagnosis not present

## 2016-01-23 DIAGNOSIS — S4992XA Unspecified injury of left shoulder and upper arm, initial encounter: Secondary | ICD-10-CM | POA: Diagnosis not present

## 2016-01-23 DIAGNOSIS — D649 Anemia, unspecified: Secondary | ICD-10-CM | POA: Diagnosis not present

## 2016-01-23 DIAGNOSIS — R5383 Other fatigue: Secondary | ICD-10-CM | POA: Diagnosis not present

## 2016-01-23 DIAGNOSIS — S42302K Unspecified fracture of shaft of humerus, left arm, subsequent encounter for fracture with nonunion: Secondary | ICD-10-CM | POA: Diagnosis not present

## 2016-01-23 DIAGNOSIS — K5909 Other constipation: Secondary | ICD-10-CM | POA: Diagnosis not present

## 2016-01-23 DIAGNOSIS — R1084 Generalized abdominal pain: Secondary | ICD-10-CM | POA: Diagnosis not present

## 2016-01-23 DIAGNOSIS — M79602 Pain in left arm: Secondary | ICD-10-CM | POA: Diagnosis not present

## 2016-01-23 DIAGNOSIS — R768 Other specified abnormal immunological findings in serum: Secondary | ICD-10-CM | POA: Diagnosis not present

## 2016-01-29 DIAGNOSIS — R1084 Generalized abdominal pain: Secondary | ICD-10-CM | POA: Diagnosis not present

## 2016-02-05 DIAGNOSIS — S42492K Other displaced fracture of lower end of left humerus, subsequent encounter for fracture with nonunion: Secondary | ICD-10-CM | POA: Diagnosis not present

## 2016-02-05 DIAGNOSIS — Z794 Long term (current) use of insulin: Secondary | ICD-10-CM | POA: Diagnosis not present

## 2016-02-05 DIAGNOSIS — E119 Type 2 diabetes mellitus without complications: Secondary | ICD-10-CM | POA: Diagnosis not present

## 2016-02-05 DIAGNOSIS — F17210 Nicotine dependence, cigarettes, uncomplicated: Secondary | ICD-10-CM | POA: Diagnosis not present

## 2016-02-05 DIAGNOSIS — S42402D Unspecified fracture of lower end of left humerus, subsequent encounter for fracture with routine healing: Secondary | ICD-10-CM | POA: Diagnosis not present

## 2016-02-05 DIAGNOSIS — Z981 Arthrodesis status: Secondary | ICD-10-CM | POA: Diagnosis not present

## 2016-02-05 DIAGNOSIS — S42402A Unspecified fracture of lower end of left humerus, initial encounter for closed fracture: Secondary | ICD-10-CM | POA: Diagnosis not present

## 2016-03-20 DIAGNOSIS — B372 Candidiasis of skin and nail: Secondary | ICD-10-CM | POA: Diagnosis not present

## 2016-03-24 DIAGNOSIS — R61 Generalized hyperhidrosis: Secondary | ICD-10-CM | POA: Diagnosis not present

## 2016-03-24 DIAGNOSIS — R5381 Other malaise: Secondary | ICD-10-CM | POA: Diagnosis not present

## 2016-03-24 DIAGNOSIS — Z7251 High risk heterosexual behavior: Secondary | ICD-10-CM | POA: Diagnosis not present

## 2016-03-24 DIAGNOSIS — R5383 Other fatigue: Secondary | ICD-10-CM | POA: Diagnosis not present

## 2016-03-24 DIAGNOSIS — R002 Palpitations: Secondary | ICD-10-CM | POA: Diagnosis not present

## 2016-04-05 DIAGNOSIS — R0781 Pleurodynia: Secondary | ICD-10-CM | POA: Diagnosis not present

## 2016-04-05 DIAGNOSIS — M545 Low back pain: Secondary | ICD-10-CM | POA: Diagnosis not present

## 2016-04-05 DIAGNOSIS — S3992XA Unspecified injury of lower back, initial encounter: Secondary | ICD-10-CM | POA: Diagnosis not present

## 2016-04-05 DIAGNOSIS — S299XXA Unspecified injury of thorax, initial encounter: Secondary | ICD-10-CM | POA: Diagnosis not present

## 2016-04-06 DIAGNOSIS — R7309 Other abnormal glucose: Secondary | ICD-10-CM | POA: Diagnosis not present

## 2016-04-06 DIAGNOSIS — F419 Anxiety disorder, unspecified: Secondary | ICD-10-CM | POA: Diagnosis not present

## 2016-04-06 DIAGNOSIS — M549 Dorsalgia, unspecified: Secondary | ICD-10-CM | POA: Diagnosis not present

## 2016-04-14 DIAGNOSIS — R531 Weakness: Secondary | ICD-10-CM | POA: Diagnosis not present

## 2016-04-14 DIAGNOSIS — T887XXA Unspecified adverse effect of drug or medicament, initial encounter: Secondary | ICD-10-CM | POA: Diagnosis not present

## 2016-04-20 DIAGNOSIS — M545 Low back pain: Secondary | ICD-10-CM | POA: Diagnosis not present

## 2016-04-20 DIAGNOSIS — M542 Cervicalgia: Secondary | ICD-10-CM | POA: Diagnosis not present

## 2016-04-21 DIAGNOSIS — R739 Hyperglycemia, unspecified: Secondary | ICD-10-CM | POA: Diagnosis not present

## 2016-04-21 DIAGNOSIS — R000 Tachycardia, unspecified: Secondary | ICD-10-CM | POA: Diagnosis not present

## 2016-04-21 DIAGNOSIS — E1165 Type 2 diabetes mellitus with hyperglycemia: Secondary | ICD-10-CM | POA: Diagnosis not present

## 2016-04-21 DIAGNOSIS — Z6837 Body mass index (BMI) 37.0-37.9, adult: Secondary | ICD-10-CM | POA: Diagnosis not present

## 2016-04-21 DIAGNOSIS — R51 Headache: Secondary | ICD-10-CM | POA: Diagnosis not present

## 2016-05-10 DIAGNOSIS — R51 Headache: Secondary | ICD-10-CM | POA: Diagnosis not present

## 2016-05-10 DIAGNOSIS — G629 Polyneuropathy, unspecified: Secondary | ICD-10-CM | POA: Diagnosis not present

## 2016-05-10 DIAGNOSIS — F419 Anxiety disorder, unspecified: Secondary | ICD-10-CM | POA: Diagnosis not present

## 2016-05-10 DIAGNOSIS — E119 Type 2 diabetes mellitus without complications: Secondary | ICD-10-CM | POA: Diagnosis not present

## 2016-05-20 DIAGNOSIS — E1165 Type 2 diabetes mellitus with hyperglycemia: Secondary | ICD-10-CM | POA: Diagnosis not present

## 2016-05-20 DIAGNOSIS — Z6838 Body mass index (BMI) 38.0-38.9, adult: Secondary | ICD-10-CM | POA: Diagnosis not present

## 2016-05-24 DIAGNOSIS — E1165 Type 2 diabetes mellitus with hyperglycemia: Secondary | ICD-10-CM | POA: Diagnosis not present

## 2016-05-24 DIAGNOSIS — Z9119 Patient's noncompliance with other medical treatment and regimen: Secondary | ICD-10-CM | POA: Diagnosis not present

## 2016-05-24 DIAGNOSIS — Z87891 Personal history of nicotine dependence: Secondary | ICD-10-CM | POA: Diagnosis not present

## 2016-05-24 DIAGNOSIS — R748 Abnormal levels of other serum enzymes: Secondary | ICD-10-CM | POA: Diagnosis not present

## 2016-05-24 DIAGNOSIS — K5900 Constipation, unspecified: Secondary | ICD-10-CM | POA: Diagnosis not present

## 2016-05-24 DIAGNOSIS — R0600 Dyspnea, unspecified: Secondary | ICD-10-CM | POA: Diagnosis not present

## 2016-05-24 DIAGNOSIS — R0602 Shortness of breath: Secondary | ICD-10-CM | POA: Diagnosis not present

## 2016-05-24 DIAGNOSIS — R197 Diarrhea, unspecified: Secondary | ICD-10-CM | POA: Diagnosis not present

## 2016-05-24 DIAGNOSIS — Z720 Tobacco use: Secondary | ICD-10-CM | POA: Diagnosis not present

## 2016-06-03 DIAGNOSIS — G8929 Other chronic pain: Secondary | ICD-10-CM | POA: Diagnosis not present

## 2016-06-03 DIAGNOSIS — M545 Low back pain: Secondary | ICD-10-CM | POA: Diagnosis not present

## 2016-06-17 DIAGNOSIS — R748 Abnormal levels of other serum enzymes: Secondary | ICD-10-CM | POA: Diagnosis not present

## 2016-06-17 DIAGNOSIS — B1920 Unspecified viral hepatitis C without hepatic coma: Secondary | ICD-10-CM | POA: Diagnosis not present

## 2016-06-17 DIAGNOSIS — E1165 Type 2 diabetes mellitus with hyperglycemia: Secondary | ICD-10-CM | POA: Diagnosis not present

## 2016-06-21 DIAGNOSIS — R160 Hepatomegaly, not elsewhere classified: Secondary | ICD-10-CM | POA: Diagnosis not present

## 2016-06-21 DIAGNOSIS — Z79899 Other long term (current) drug therapy: Secondary | ICD-10-CM | POA: Diagnosis not present

## 2016-06-21 DIAGNOSIS — R1084 Generalized abdominal pain: Secondary | ICD-10-CM | POA: Diagnosis not present

## 2016-06-21 DIAGNOSIS — B1920 Unspecified viral hepatitis C without hepatic coma: Secondary | ICD-10-CM | POA: Diagnosis not present

## 2016-06-21 DIAGNOSIS — R768 Other specified abnormal immunological findings in serum: Secondary | ICD-10-CM | POA: Diagnosis not present

## 2016-06-21 DIAGNOSIS — R945 Abnormal results of liver function studies: Secondary | ICD-10-CM | POA: Diagnosis not present

## 2016-06-24 DIAGNOSIS — E7800 Pure hypercholesterolemia, unspecified: Secondary | ICD-10-CM | POA: Diagnosis not present

## 2016-06-24 DIAGNOSIS — F329 Major depressive disorder, single episode, unspecified: Secondary | ICD-10-CM | POA: Diagnosis not present

## 2016-06-24 DIAGNOSIS — F17200 Nicotine dependence, unspecified, uncomplicated: Secondary | ICD-10-CM | POA: Diagnosis not present

## 2016-06-24 DIAGNOSIS — F419 Anxiety disorder, unspecified: Secondary | ICD-10-CM | POA: Diagnosis not present

## 2016-06-24 DIAGNOSIS — G8929 Other chronic pain: Secondary | ICD-10-CM | POA: Diagnosis not present

## 2016-06-24 DIAGNOSIS — Z5329 Procedure and treatment not carried out because of patient's decision for other reasons: Secondary | ICD-10-CM | POA: Diagnosis not present

## 2016-06-24 DIAGNOSIS — Z79899 Other long term (current) drug therapy: Secondary | ICD-10-CM | POA: Diagnosis not present

## 2016-06-24 DIAGNOSIS — E1165 Type 2 diabetes mellitus with hyperglycemia: Secondary | ICD-10-CM | POA: Diagnosis not present

## 2016-06-24 DIAGNOSIS — Z794 Long term (current) use of insulin: Secondary | ICD-10-CM | POA: Diagnosis not present

## 2016-06-24 DIAGNOSIS — R1011 Right upper quadrant pain: Secondary | ICD-10-CM | POA: Diagnosis not present

## 2016-06-24 DIAGNOSIS — R05 Cough: Secondary | ICD-10-CM | POA: Diagnosis not present

## 2016-06-28 DIAGNOSIS — R1084 Generalized abdominal pain: Secondary | ICD-10-CM | POA: Diagnosis not present

## 2016-07-02 DIAGNOSIS — R160 Hepatomegaly, not elsewhere classified: Secondary | ICD-10-CM | POA: Diagnosis not present

## 2016-07-02 DIAGNOSIS — R1084 Generalized abdominal pain: Secondary | ICD-10-CM | POA: Diagnosis not present

## 2016-07-02 DIAGNOSIS — R768 Other specified abnormal immunological findings in serum: Secondary | ICD-10-CM | POA: Diagnosis not present

## 2016-07-02 DIAGNOSIS — R945 Abnormal results of liver function studies: Secondary | ICD-10-CM | POA: Diagnosis not present

## 2016-07-12 DIAGNOSIS — R748 Abnormal levels of other serum enzymes: Secondary | ICD-10-CM | POA: Diagnosis not present

## 2016-07-12 DIAGNOSIS — E1165 Type 2 diabetes mellitus with hyperglycemia: Secondary | ICD-10-CM | POA: Diagnosis not present

## 2016-07-12 DIAGNOSIS — R7989 Other specified abnormal findings of blood chemistry: Secondary | ICD-10-CM | POA: Diagnosis not present

## 2016-07-15 DIAGNOSIS — E1165 Type 2 diabetes mellitus with hyperglycemia: Secondary | ICD-10-CM | POA: Diagnosis not present

## 2016-07-15 DIAGNOSIS — E7800 Pure hypercholesterolemia, unspecified: Secondary | ICD-10-CM | POA: Diagnosis not present

## 2016-07-15 DIAGNOSIS — F419 Anxiety disorder, unspecified: Secondary | ICD-10-CM | POA: Diagnosis not present

## 2016-07-20 DIAGNOSIS — Z760 Encounter for issue of repeat prescription: Secondary | ICD-10-CM | POA: Diagnosis not present

## 2016-07-20 DIAGNOSIS — E669 Obesity, unspecified: Secondary | ICD-10-CM | POA: Diagnosis not present

## 2016-07-20 DIAGNOSIS — E1165 Type 2 diabetes mellitus with hyperglycemia: Secondary | ICD-10-CM | POA: Diagnosis not present

## 2016-07-20 DIAGNOSIS — Z6838 Body mass index (BMI) 38.0-38.9, adult: Secondary | ICD-10-CM | POA: Diagnosis not present

## 2016-07-30 DIAGNOSIS — E1165 Type 2 diabetes mellitus with hyperglycemia: Secondary | ICD-10-CM | POA: Diagnosis not present

## 2016-07-30 DIAGNOSIS — R7989 Other specified abnormal findings of blood chemistry: Secondary | ICD-10-CM | POA: Diagnosis not present

## 2016-07-30 DIAGNOSIS — B1920 Unspecified viral hepatitis C without hepatic coma: Secondary | ICD-10-CM | POA: Diagnosis not present

## 2016-07-30 DIAGNOSIS — E119 Type 2 diabetes mellitus without complications: Secondary | ICD-10-CM | POA: Diagnosis not present

## 2016-07-30 DIAGNOSIS — R799 Abnormal finding of blood chemistry, unspecified: Secondary | ICD-10-CM | POA: Diagnosis not present

## 2016-07-30 DIAGNOSIS — E1142 Type 2 diabetes mellitus with diabetic polyneuropathy: Secondary | ICD-10-CM | POA: Diagnosis not present

## 2016-07-30 DIAGNOSIS — E669 Obesity, unspecified: Secondary | ICD-10-CM | POA: Diagnosis not present

## 2016-07-30 DIAGNOSIS — Z794 Long term (current) use of insulin: Secondary | ICD-10-CM | POA: Diagnosis not present

## 2016-07-30 DIAGNOSIS — R748 Abnormal levels of other serum enzymes: Secondary | ICD-10-CM | POA: Diagnosis not present

## 2016-08-05 DIAGNOSIS — Z6839 Body mass index (BMI) 39.0-39.9, adult: Secondary | ICD-10-CM | POA: Diagnosis not present

## 2016-08-05 DIAGNOSIS — F418 Other specified anxiety disorders: Secondary | ICD-10-CM | POA: Diagnosis not present

## 2016-08-05 DIAGNOSIS — M79602 Pain in left arm: Secondary | ICD-10-CM | POA: Diagnosis not present

## 2016-08-05 DIAGNOSIS — E669 Obesity, unspecified: Secondary | ICD-10-CM | POA: Diagnosis not present

## 2016-08-05 DIAGNOSIS — N6452 Nipple discharge: Secondary | ICD-10-CM | POA: Diagnosis not present

## 2016-08-05 DIAGNOSIS — H6693 Otitis media, unspecified, bilateral: Secondary | ICD-10-CM | POA: Diagnosis not present

## 2016-08-16 DIAGNOSIS — N644 Mastodynia: Secondary | ICD-10-CM | POA: Diagnosis not present

## 2016-08-31 DIAGNOSIS — F418 Other specified anxiety disorders: Secondary | ICD-10-CM | POA: Diagnosis not present

## 2016-08-31 DIAGNOSIS — E669 Obesity, unspecified: Secondary | ICD-10-CM | POA: Diagnosis not present

## 2016-08-31 DIAGNOSIS — G8929 Other chronic pain: Secondary | ICD-10-CM | POA: Diagnosis not present

## 2016-08-31 DIAGNOSIS — Z6839 Body mass index (BMI) 39.0-39.9, adult: Secondary | ICD-10-CM | POA: Diagnosis not present

## 2016-09-09 DIAGNOSIS — M436 Torticollis: Secondary | ICD-10-CM | POA: Diagnosis not present

## 2016-09-09 DIAGNOSIS — Z6839 Body mass index (BMI) 39.0-39.9, adult: Secondary | ICD-10-CM | POA: Diagnosis not present

## 2016-09-09 DIAGNOSIS — E669 Obesity, unspecified: Secondary | ICD-10-CM | POA: Diagnosis not present

## 2016-09-15 DIAGNOSIS — H6990 Unspecified Eustachian tube disorder, unspecified ear: Secondary | ICD-10-CM | POA: Diagnosis not present

## 2016-12-14 DIAGNOSIS — E119 Type 2 diabetes mellitus without complications: Secondary | ICD-10-CM | POA: Diagnosis not present

## 2016-12-14 DIAGNOSIS — M797 Fibromyalgia: Secondary | ICD-10-CM | POA: Diagnosis not present

## 2016-12-14 DIAGNOSIS — E6609 Other obesity due to excess calories: Secondary | ICD-10-CM | POA: Diagnosis not present

## 2017-02-24 DIAGNOSIS — F419 Anxiety disorder, unspecified: Secondary | ICD-10-CM | POA: Diagnosis not present

## 2017-02-28 DIAGNOSIS — E1121 Type 2 diabetes mellitus with diabetic nephropathy: Secondary | ICD-10-CM | POA: Diagnosis not present

## 2017-02-28 DIAGNOSIS — F418 Other specified anxiety disorders: Secondary | ICD-10-CM | POA: Diagnosis not present

## 2017-04-04 DIAGNOSIS — M545 Low back pain: Secondary | ICD-10-CM | POA: Diagnosis not present

## 2017-04-04 DIAGNOSIS — G8929 Other chronic pain: Secondary | ICD-10-CM | POA: Diagnosis not present

## 2017-06-02 DIAGNOSIS — E1121 Type 2 diabetes mellitus with diabetic nephropathy: Secondary | ICD-10-CM | POA: Diagnosis not present

## 2017-06-02 DIAGNOSIS — M545 Low back pain: Secondary | ICD-10-CM | POA: Diagnosis not present

## 2017-06-02 DIAGNOSIS — G8929 Other chronic pain: Secondary | ICD-10-CM | POA: Diagnosis not present

## 2017-06-02 DIAGNOSIS — Z9119 Patient's noncompliance with other medical treatment and regimen: Secondary | ICD-10-CM | POA: Diagnosis not present

## 2017-08-01 DIAGNOSIS — E1121 Type 2 diabetes mellitus with diabetic nephropathy: Secondary | ICD-10-CM | POA: Diagnosis not present

## 2017-08-01 DIAGNOSIS — F419 Anxiety disorder, unspecified: Secondary | ICD-10-CM | POA: Diagnosis not present

## 2017-08-01 DIAGNOSIS — Z79899 Other long term (current) drug therapy: Secondary | ICD-10-CM | POA: Diagnosis not present

## 2017-08-21 DIAGNOSIS — M545 Low back pain: Secondary | ICD-10-CM | POA: Diagnosis not present

## 2017-08-21 DIAGNOSIS — S299XXA Unspecified injury of thorax, initial encounter: Secondary | ICD-10-CM | POA: Diagnosis not present

## 2017-08-21 DIAGNOSIS — S79912A Unspecified injury of left hip, initial encounter: Secondary | ICD-10-CM | POA: Diagnosis not present

## 2017-08-21 DIAGNOSIS — F17210 Nicotine dependence, cigarettes, uncomplicated: Secondary | ICD-10-CM | POA: Diagnosis not present

## 2017-08-21 DIAGNOSIS — S3992XA Unspecified injury of lower back, initial encounter: Secondary | ICD-10-CM | POA: Diagnosis not present

## 2017-08-21 DIAGNOSIS — R0781 Pleurodynia: Secondary | ICD-10-CM | POA: Diagnosis not present

## 2017-08-21 DIAGNOSIS — M25552 Pain in left hip: Secondary | ICD-10-CM | POA: Diagnosis not present

## 2017-08-22 DIAGNOSIS — F17200 Nicotine dependence, unspecified, uncomplicated: Secondary | ICD-10-CM | POA: Diagnosis not present

## 2017-08-22 DIAGNOSIS — S8012XA Contusion of left lower leg, initial encounter: Secondary | ICD-10-CM | POA: Diagnosis not present

## 2017-08-22 DIAGNOSIS — S8011XA Contusion of right lower leg, initial encounter: Secondary | ICD-10-CM | POA: Diagnosis not present

## 2017-08-22 DIAGNOSIS — S300XXA Contusion of lower back and pelvis, initial encounter: Secondary | ICD-10-CM | POA: Diagnosis not present

## 2017-10-28 DIAGNOSIS — M545 Low back pain: Secondary | ICD-10-CM | POA: Diagnosis not present

## 2017-10-28 DIAGNOSIS — E1165 Type 2 diabetes mellitus with hyperglycemia: Secondary | ICD-10-CM | POA: Diagnosis not present

## 2017-10-28 DIAGNOSIS — F419 Anxiety disorder, unspecified: Secondary | ICD-10-CM | POA: Diagnosis not present

## 2017-10-28 DIAGNOSIS — E1140 Type 2 diabetes mellitus with diabetic neuropathy, unspecified: Secondary | ICD-10-CM | POA: Diagnosis not present

## 2017-10-28 DIAGNOSIS — G8929 Other chronic pain: Secondary | ICD-10-CM | POA: Diagnosis not present
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
## 2017-12-08 IMAGING — DX DG KNEE 1-2V*R*
2 series · 2 of 2 positions shown · non-contrast
Comparison: None.

CLINICAL DATA: Fall today.  Right knee anterior abrasions.

EXAM:
RIGHT KNEE - 1-2 VIEW

[knee ap]
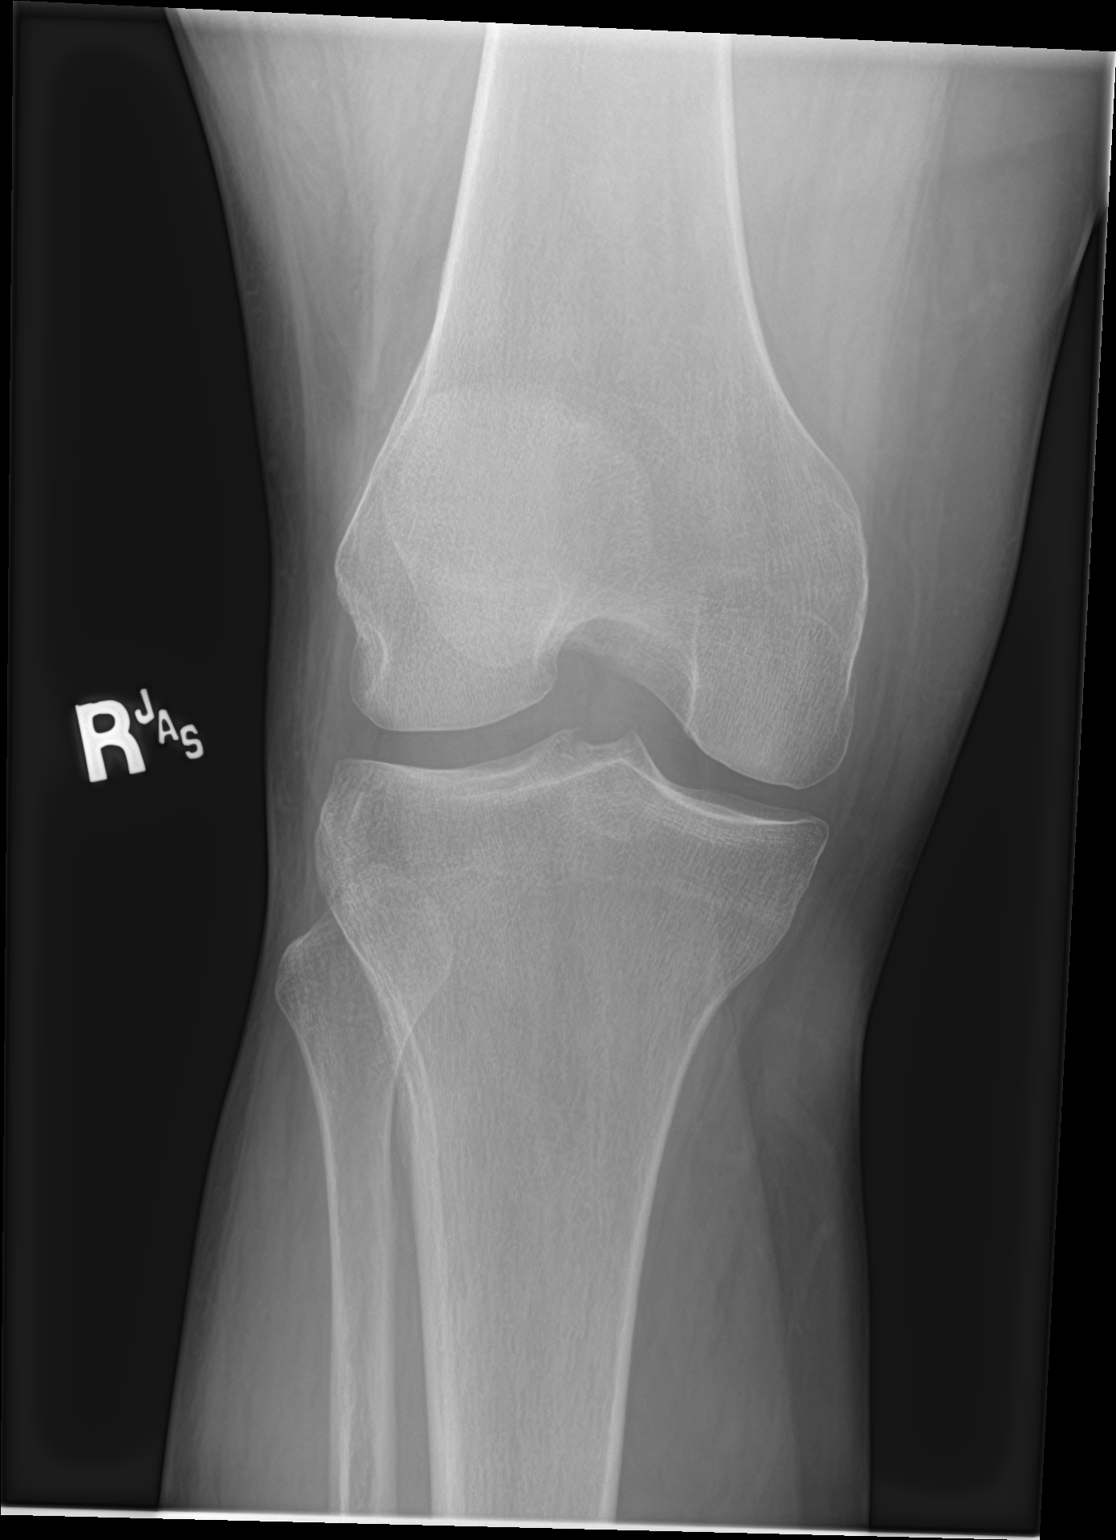

[knee lat]
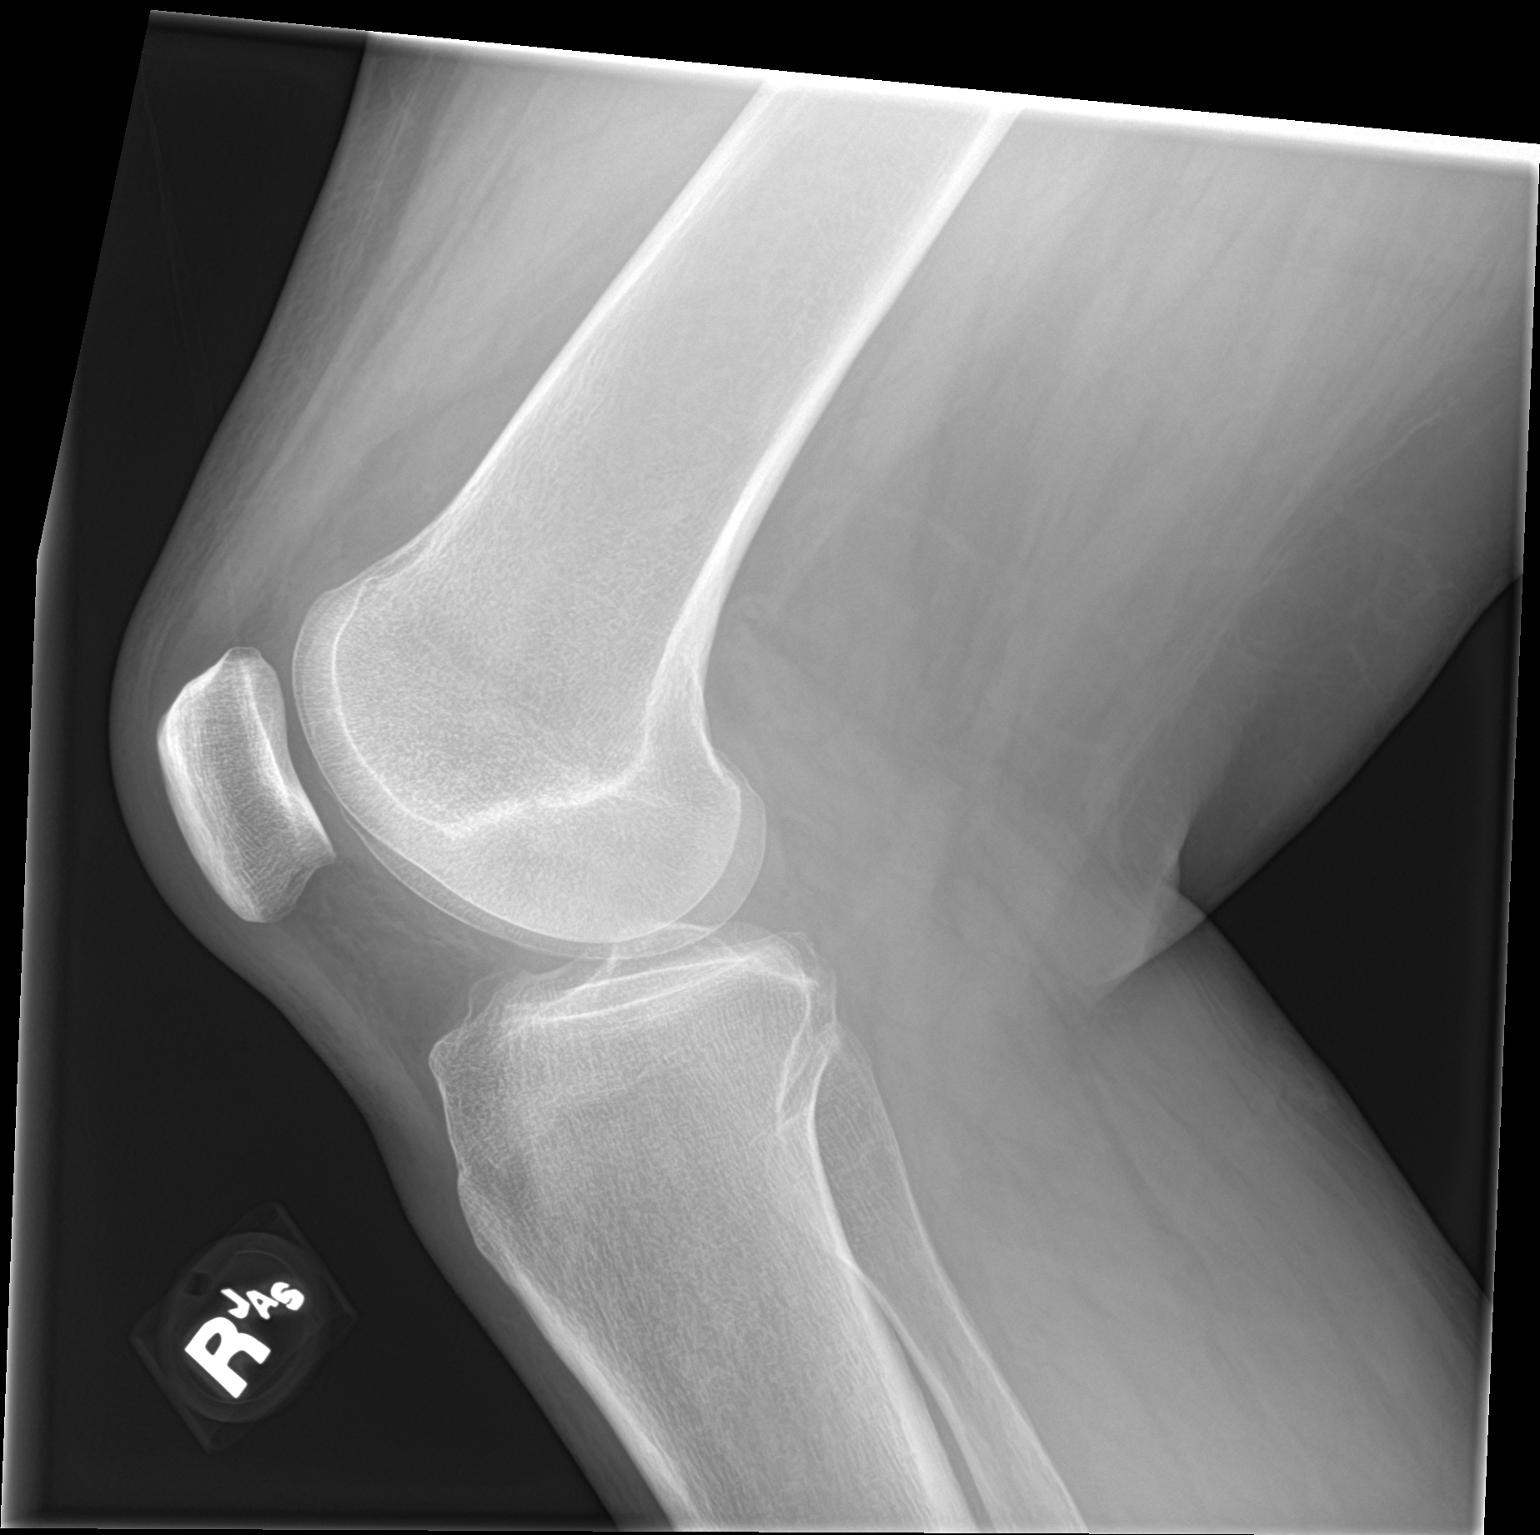

[2 of 2 positions shown; findings below may reference images not displayed]

FINDINGS: No evidence of fracture, dislocation, or joint effusion. No evidence
of arthropathy or other focal bone abnormality. Soft tissues are
unremarkable.
IMPRESSION: Negative two view right knee radiographs.

## 2017-12-08 IMAGING — DX DG WRIST COMPLETE 3+V*R*
4 series · 4 of 4 positions shown · non-contrast
Comparison: None.

CLINICAL DATA: Fall today.  Ventral wrist pain.  Initial encounter.

EXAM:
RIGHT WRIST - COMPLETE 3+ VIEW

[wrist pa]
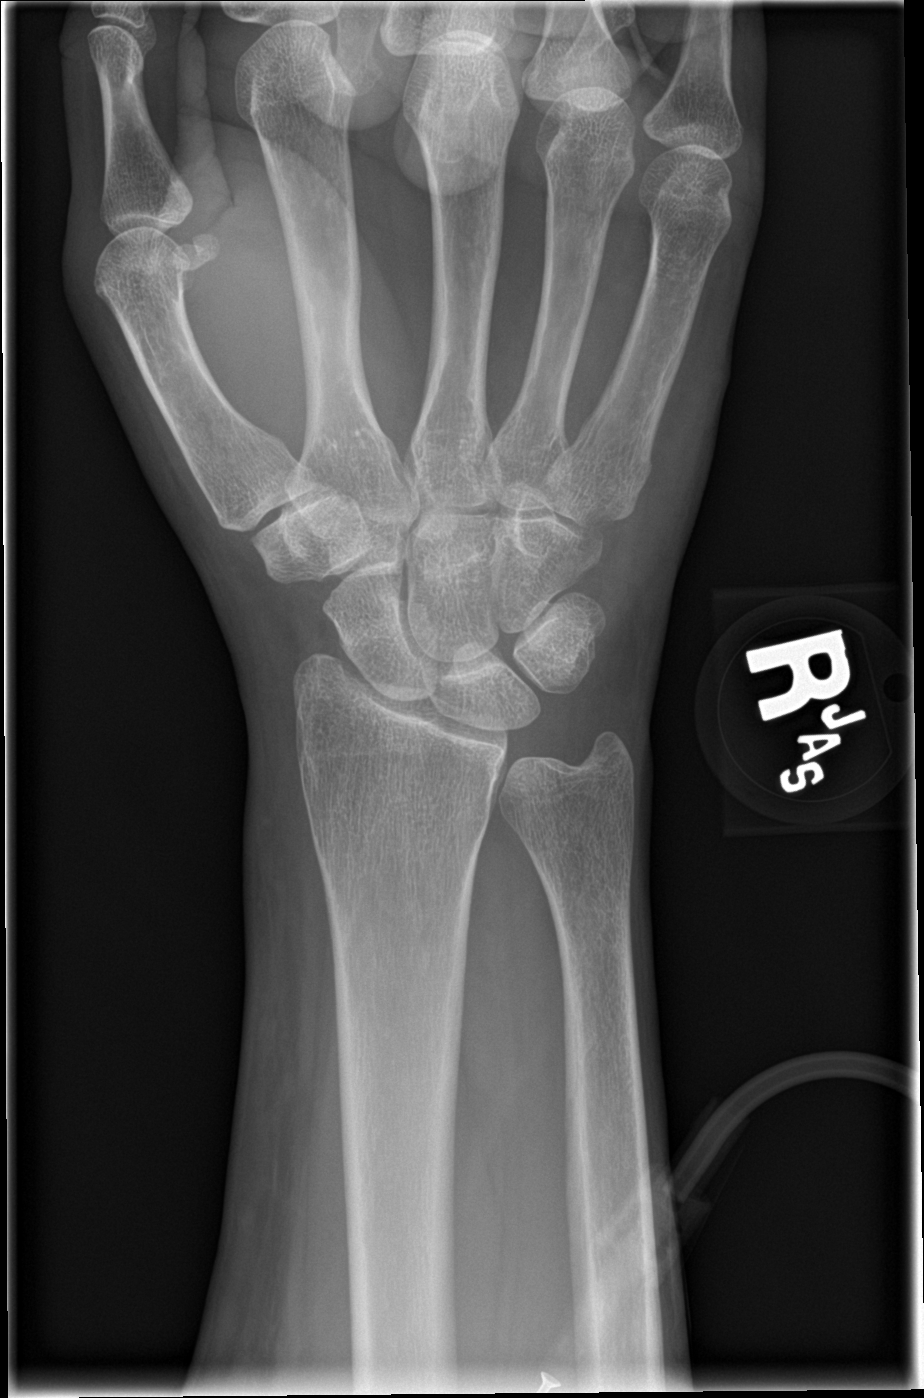

[wrist obl]
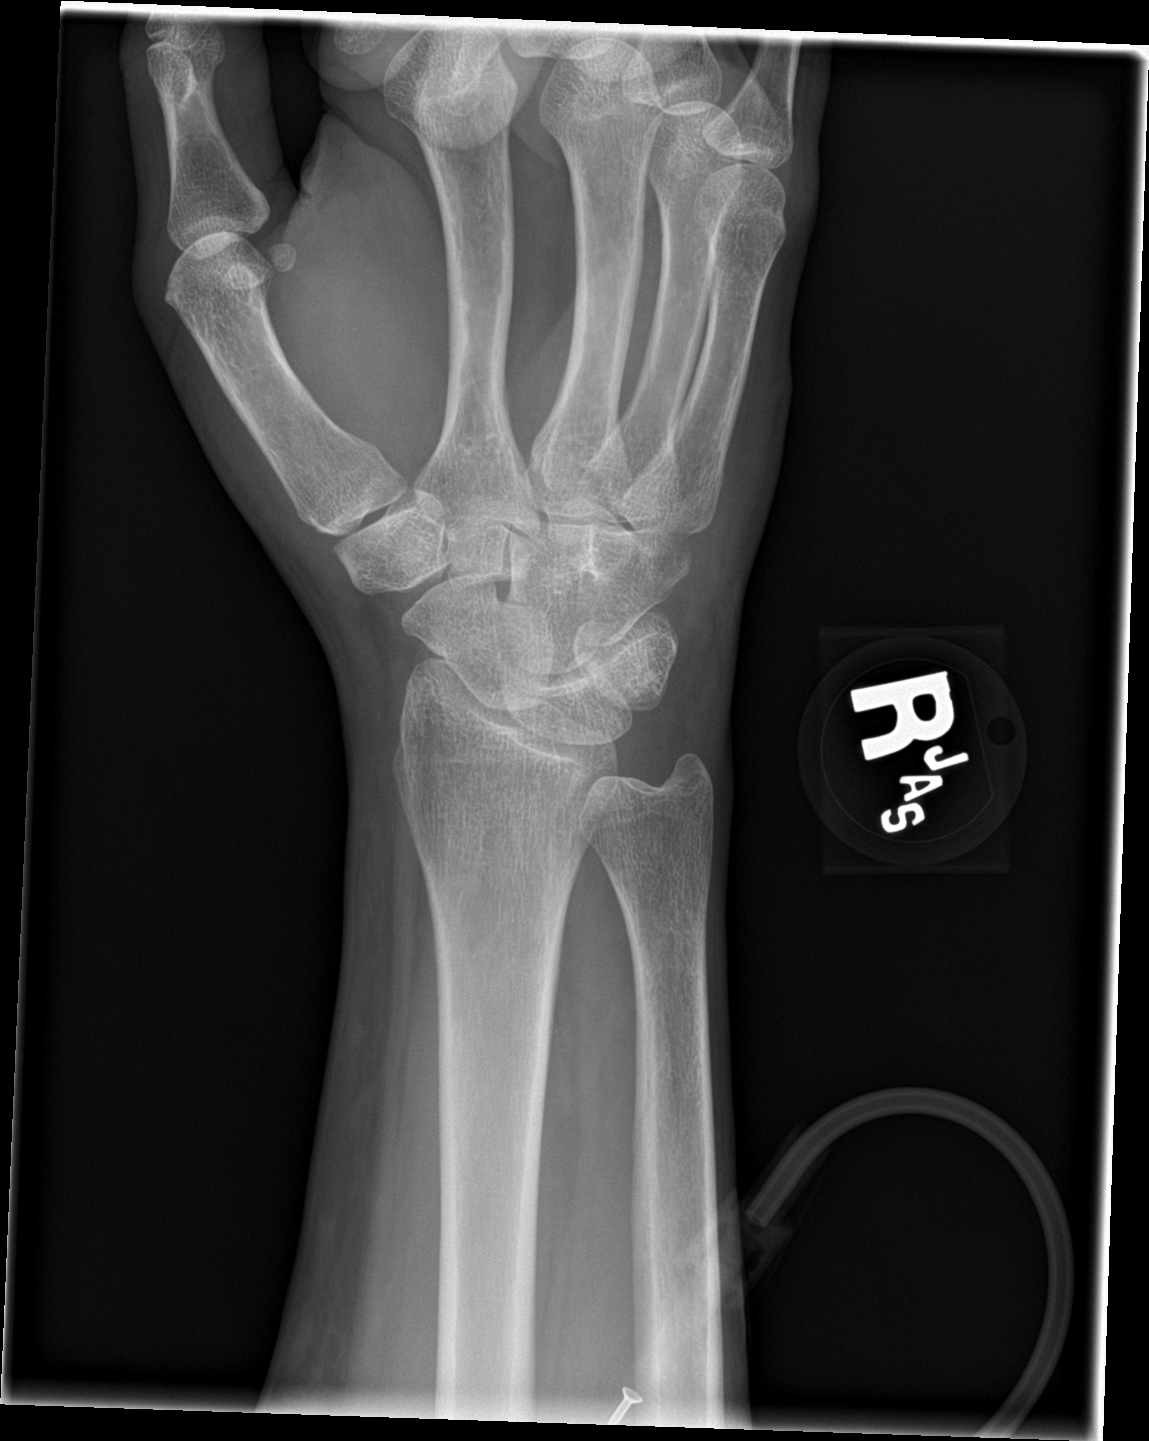

[wrist lat]
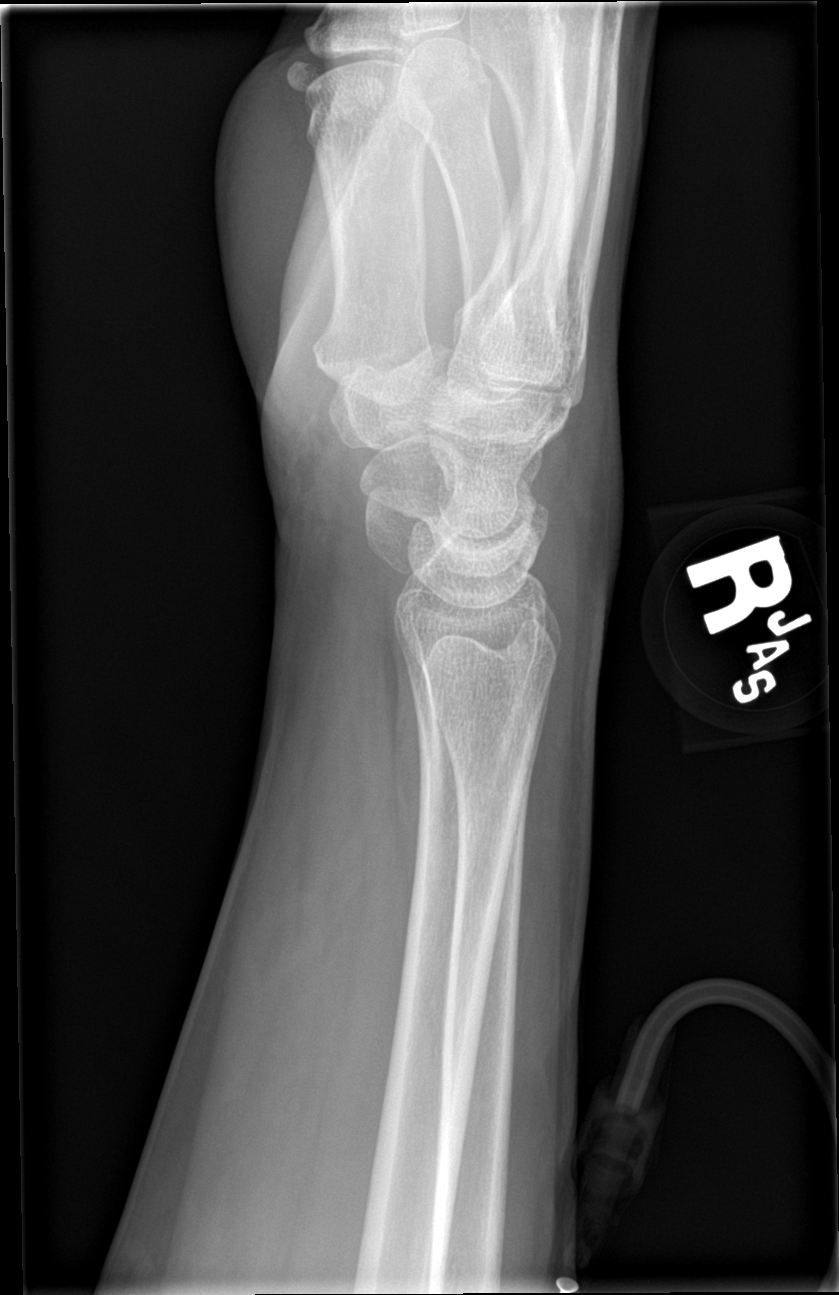

[wrist navicular]
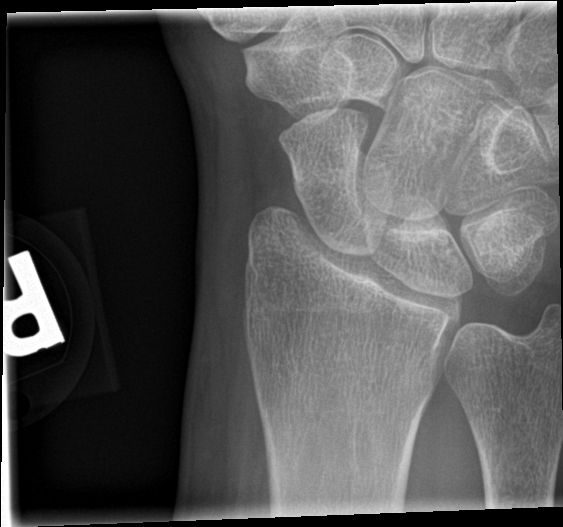

[4 of 4 positions shown; findings below may reference images not displayed]

FINDINGS: Soft tissue swelling is noted anterior to the wrist. There is no
underlying fracture. The carpal bones are intact. The distal radius
and ulna are within normal limits.
IMPRESSION: Soft tissue swelling at the ventral wrist without underlying
fracture.

## 2017-12-12 DIAGNOSIS — M797 Fibromyalgia: Secondary | ICD-10-CM | POA: Diagnosis not present

## 2017-12-12 DIAGNOSIS — G4700 Insomnia, unspecified: Secondary | ICD-10-CM | POA: Diagnosis not present

## 2017-12-12 DIAGNOSIS — E669 Obesity, unspecified: Secondary | ICD-10-CM | POA: Diagnosis not present

## 2017-12-12 DIAGNOSIS — F411 Generalized anxiety disorder: Secondary | ICD-10-CM | POA: Diagnosis not present

## 2018-01-20 DIAGNOSIS — L0291 Cutaneous abscess, unspecified: Secondary | ICD-10-CM | POA: Diagnosis not present

## 2018-01-20 DIAGNOSIS — Z6834 Body mass index (BMI) 34.0-34.9, adult: Secondary | ICD-10-CM | POA: Diagnosis not present

## 2018-02-28 DIAGNOSIS — M545 Low back pain: Secondary | ICD-10-CM | POA: Diagnosis not present

## 2018-02-28 DIAGNOSIS — F411 Generalized anxiety disorder: Secondary | ICD-10-CM | POA: Diagnosis not present

## 2018-02-28 DIAGNOSIS — M797 Fibromyalgia: Secondary | ICD-10-CM | POA: Diagnosis not present

## 2018-02-28 DIAGNOSIS — G4700 Insomnia, unspecified: Secondary | ICD-10-CM | POA: Diagnosis not present

## 2018-04-14 DIAGNOSIS — G890 Central pain syndrome: Secondary | ICD-10-CM | POA: Diagnosis not present

## 2018-04-14 DIAGNOSIS — M797 Fibromyalgia: Secondary | ICD-10-CM | POA: Diagnosis not present

## 2018-04-14 DIAGNOSIS — M545 Low back pain: Secondary | ICD-10-CM | POA: Diagnosis not present

## 2018-04-14 DIAGNOSIS — M542 Cervicalgia: Secondary | ICD-10-CM | POA: Diagnosis not present

## 2018-04-14 DIAGNOSIS — M79622 Pain in left upper arm: Secondary | ICD-10-CM | POA: Diagnosis not present

## 2018-04-28 DIAGNOSIS — M545 Low back pain: Secondary | ICD-10-CM | POA: Diagnosis not present

## 2018-04-28 DIAGNOSIS — G894 Chronic pain syndrome: Secondary | ICD-10-CM | POA: Diagnosis not present

## 2018-04-28 DIAGNOSIS — M542 Cervicalgia: Secondary | ICD-10-CM | POA: Diagnosis not present

## 2018-04-28 DIAGNOSIS — M79622 Pain in left upper arm: Secondary | ICD-10-CM | POA: Diagnosis not present

## 2018-05-16 DIAGNOSIS — J069 Acute upper respiratory infection, unspecified: Secondary | ICD-10-CM | POA: Diagnosis not present

## 2018-05-16 DIAGNOSIS — Z6834 Body mass index (BMI) 34.0-34.9, adult: Secondary | ICD-10-CM | POA: Diagnosis not present

## 2018-05-19 DIAGNOSIS — F411 Generalized anxiety disorder: Secondary | ICD-10-CM | POA: Diagnosis not present

## 2018-05-19 DIAGNOSIS — Z6834 Body mass index (BMI) 34.0-34.9, adult: Secondary | ICD-10-CM | POA: Diagnosis not present

## 2018-05-19 DIAGNOSIS — M797 Fibromyalgia: Secondary | ICD-10-CM | POA: Diagnosis not present

## 2018-05-30 DIAGNOSIS — M79622 Pain in left upper arm: Secondary | ICD-10-CM | POA: Diagnosis not present

## 2018-05-30 DIAGNOSIS — G894 Chronic pain syndrome: Secondary | ICD-10-CM | POA: Diagnosis not present

## 2018-05-30 DIAGNOSIS — M545 Low back pain: Secondary | ICD-10-CM | POA: Diagnosis not present

## 2018-05-30 DIAGNOSIS — M797 Fibromyalgia: Secondary | ICD-10-CM | POA: Diagnosis not present

## 2018-05-30 DIAGNOSIS — M542 Cervicalgia: Secondary | ICD-10-CM | POA: Diagnosis not present

## 2018-06-14 DIAGNOSIS — M797 Fibromyalgia: Secondary | ICD-10-CM | POA: Diagnosis not present

## 2018-06-14 DIAGNOSIS — E1140 Type 2 diabetes mellitus with diabetic neuropathy, unspecified: Secondary | ICD-10-CM | POA: Diagnosis not present

## 2018-06-14 DIAGNOSIS — Z6834 Body mass index (BMI) 34.0-34.9, adult: Secondary | ICD-10-CM | POA: Diagnosis not present

## 2018-08-04 DIAGNOSIS — M797 Fibromyalgia: Secondary | ICD-10-CM | POA: Diagnosis not present

## 2018-08-04 DIAGNOSIS — E1140 Type 2 diabetes mellitus with diabetic neuropathy, unspecified: Secondary | ICD-10-CM | POA: Diagnosis not present

## 2018-08-04 DIAGNOSIS — Z87891 Personal history of nicotine dependence: Secondary | ICD-10-CM | POA: Diagnosis not present

## 2018-08-04 DIAGNOSIS — G4700 Insomnia, unspecified: Secondary | ICD-10-CM | POA: Diagnosis not present

## 2018-08-04 DIAGNOSIS — F419 Anxiety disorder, unspecified: Secondary | ICD-10-CM | POA: Diagnosis not present

## 2018-08-25 DIAGNOSIS — Z6833 Body mass index (BMI) 33.0-33.9, adult: Secondary | ICD-10-CM | POA: Diagnosis not present

## 2018-08-25 DIAGNOSIS — E1140 Type 2 diabetes mellitus with diabetic neuropathy, unspecified: Secondary | ICD-10-CM | POA: Diagnosis not present

## 2018-08-29 DIAGNOSIS — Z6832 Body mass index (BMI) 32.0-32.9, adult: Secondary | ICD-10-CM | POA: Diagnosis not present

## 2018-08-29 DIAGNOSIS — E1140 Type 2 diabetes mellitus with diabetic neuropathy, unspecified: Secondary | ICD-10-CM | POA: Diagnosis not present

## 2018-08-30 DIAGNOSIS — G894 Chronic pain syndrome: Secondary | ICD-10-CM | POA: Diagnosis not present

## 2018-08-30 DIAGNOSIS — M797 Fibromyalgia: Secondary | ICD-10-CM | POA: Diagnosis not present

## 2018-08-30 DIAGNOSIS — M542 Cervicalgia: Secondary | ICD-10-CM | POA: Diagnosis not present

## 2018-08-30 DIAGNOSIS — M79622 Pain in left upper arm: Secondary | ICD-10-CM | POA: Diagnosis not present

## 2018-08-30 DIAGNOSIS — M545 Low back pain: Secondary | ICD-10-CM | POA: Diagnosis not present

## 2018-09-08 DIAGNOSIS — M545 Low back pain: Secondary | ICD-10-CM | POA: Diagnosis not present

## 2018-09-08 DIAGNOSIS — F418 Other specified anxiety disorders: Secondary | ICD-10-CM | POA: Diagnosis not present

## 2018-09-08 DIAGNOSIS — E1121 Type 2 diabetes mellitus with diabetic nephropathy: Secondary | ICD-10-CM | POA: Diagnosis not present

## 2018-09-08 DIAGNOSIS — G59 Mononeuropathy in diseases classified elsewhere: Secondary | ICD-10-CM | POA: Diagnosis not present

## 2018-09-08 DIAGNOSIS — G8929 Other chronic pain: Secondary | ICD-10-CM | POA: Diagnosis not present

## 2018-09-08 DIAGNOSIS — Z79899 Other long term (current) drug therapy: Secondary | ICD-10-CM | POA: Diagnosis not present

## 2018-11-06 DIAGNOSIS — R319 Hematuria, unspecified: Secondary | ICD-10-CM | POA: Diagnosis not present

## 2018-11-12 DIAGNOSIS — D751 Secondary polycythemia: Secondary | ICD-10-CM | POA: Diagnosis not present

## 2018-11-12 DIAGNOSIS — R739 Hyperglycemia, unspecified: Secondary | ICD-10-CM | POA: Diagnosis not present

## 2018-11-12 DIAGNOSIS — R531 Weakness: Secondary | ICD-10-CM | POA: Diagnosis not present

## 2018-11-12 DIAGNOSIS — R5383 Other fatigue: Secondary | ICD-10-CM | POA: Diagnosis not present

## 2018-11-22 DIAGNOSIS — M542 Cervicalgia: Secondary | ICD-10-CM | POA: Diagnosis not present

## 2018-11-22 DIAGNOSIS — M25559 Pain in unspecified hip: Secondary | ICD-10-CM | POA: Diagnosis not present

## 2019-02-27 DIAGNOSIS — M797 Fibromyalgia: Secondary | ICD-10-CM | POA: Diagnosis not present

## 2019-02-27 DIAGNOSIS — G629 Polyneuropathy, unspecified: Secondary | ICD-10-CM | POA: Diagnosis not present

## 2019-03-05 DIAGNOSIS — R4586 Emotional lability: Secondary | ICD-10-CM | POA: Diagnosis not present

## 2019-03-05 DIAGNOSIS — R454 Irritability and anger: Secondary | ICD-10-CM | POA: Diagnosis not present

## 2019-03-05 DIAGNOSIS — R4587 Impulsiveness: Secondary | ICD-10-CM | POA: Diagnosis not present

## 2019-03-05 DIAGNOSIS — F419 Anxiety disorder, unspecified: Secondary | ICD-10-CM | POA: Diagnosis not present

## 2019-03-26 DIAGNOSIS — Z760 Encounter for issue of repeat prescription: Secondary | ICD-10-CM | POA: Diagnosis not present

## 2019-03-26 DIAGNOSIS — R4586 Emotional lability: Secondary | ICD-10-CM | POA: Diagnosis not present

## 2019-03-26 DIAGNOSIS — R4587 Impulsiveness: Secondary | ICD-10-CM | POA: Diagnosis not present

## 2019-03-26 DIAGNOSIS — R454 Irritability and anger: Secondary | ICD-10-CM | POA: Diagnosis not present

## 2019-03-26 DIAGNOSIS — F419 Anxiety disorder, unspecified: Secondary | ICD-10-CM | POA: Diagnosis not present

## 2019-03-29 DIAGNOSIS — M79673 Pain in unspecified foot: Secondary | ICD-10-CM | POA: Diagnosis not present

## 2019-03-31 DIAGNOSIS — M79605 Pain in left leg: Secondary | ICD-10-CM | POA: Diagnosis not present

## 2019-03-31 DIAGNOSIS — Z760 Encounter for issue of repeat prescription: Secondary | ICD-10-CM | POA: Diagnosis not present

## 2019-03-31 DIAGNOSIS — E0849 Diabetes mellitus due to underlying condition with other diabetic neurological complication: Secondary | ICD-10-CM | POA: Diagnosis not present

## 2019-03-31 DIAGNOSIS — E1140 Type 2 diabetes mellitus with diabetic neuropathy, unspecified: Secondary | ICD-10-CM | POA: Diagnosis not present

## 2019-03-31 DIAGNOSIS — F17210 Nicotine dependence, cigarettes, uncomplicated: Secondary | ICD-10-CM | POA: Diagnosis not present

## 2019-03-31 DIAGNOSIS — M79604 Pain in right leg: Secondary | ICD-10-CM | POA: Diagnosis not present

## 2019-04-02 DIAGNOSIS — R454 Irritability and anger: Secondary | ICD-10-CM | POA: Diagnosis not present

## 2019-04-02 DIAGNOSIS — M797 Fibromyalgia: Secondary | ICD-10-CM | POA: Diagnosis not present

## 2019-04-02 DIAGNOSIS — E1140 Type 2 diabetes mellitus with diabetic neuropathy, unspecified: Secondary | ICD-10-CM | POA: Diagnosis not present

## 2019-04-02 DIAGNOSIS — Z79899 Other long term (current) drug therapy: Secondary | ICD-10-CM | POA: Diagnosis not present

## 2019-04-02 DIAGNOSIS — E669 Obesity, unspecified: Secondary | ICD-10-CM | POA: Diagnosis not present

## 2019-04-02 DIAGNOSIS — R4587 Impulsiveness: Secondary | ICD-10-CM | POA: Diagnosis not present

## 2019-04-02 DIAGNOSIS — R4586 Emotional lability: Secondary | ICD-10-CM | POA: Diagnosis not present

## 2019-04-02 DIAGNOSIS — F17200 Nicotine dependence, unspecified, uncomplicated: Secondary | ICD-10-CM | POA: Diagnosis not present

## 2019-04-02 DIAGNOSIS — F419 Anxiety disorder, unspecified: Secondary | ICD-10-CM | POA: Diagnosis not present

## 2019-04-02 DIAGNOSIS — G4709 Other insomnia: Secondary | ICD-10-CM | POA: Diagnosis not present

## 2019-04-02 DIAGNOSIS — Z9119 Patient's noncompliance with other medical treatment and regimen: Secondary | ICD-10-CM | POA: Diagnosis not present

## 2019-04-25 DIAGNOSIS — N2889 Other specified disorders of kidney and ureter: Secondary | ICD-10-CM | POA: Diagnosis not present

## 2019-04-25 DIAGNOSIS — R1030 Lower abdominal pain, unspecified: Secondary | ICD-10-CM | POA: Diagnosis not present

## 2019-04-25 DIAGNOSIS — E1042 Type 1 diabetes mellitus with diabetic polyneuropathy: Secondary | ICD-10-CM | POA: Diagnosis not present

## 2019-04-25 DIAGNOSIS — E1065 Type 1 diabetes mellitus with hyperglycemia: Secondary | ICD-10-CM | POA: Diagnosis not present

## 2019-04-26 DIAGNOSIS — F419 Anxiety disorder, unspecified: Secondary | ICD-10-CM | POA: Diagnosis not present

## 2019-04-26 DIAGNOSIS — R5383 Other fatigue: Secondary | ICD-10-CM | POA: Diagnosis not present

## 2019-04-26 DIAGNOSIS — E1140 Type 2 diabetes mellitus with diabetic neuropathy, unspecified: Secondary | ICD-10-CM | POA: Diagnosis not present

## 2019-04-26 DIAGNOSIS — N2889 Other specified disorders of kidney and ureter: Secondary | ICD-10-CM | POA: Diagnosis not present

## 2019-04-27 DIAGNOSIS — C641 Malignant neoplasm of right kidney, except renal pelvis: Secondary | ICD-10-CM | POA: Diagnosis not present

## 2019-04-30 DIAGNOSIS — R5383 Other fatigue: Secondary | ICD-10-CM | POA: Diagnosis not present

## 2019-04-30 DIAGNOSIS — E1140 Type 2 diabetes mellitus with diabetic neuropathy, unspecified: Secondary | ICD-10-CM | POA: Diagnosis not present

## 2019-04-30 DIAGNOSIS — M79609 Pain in unspecified limb: Secondary | ICD-10-CM | POA: Diagnosis not present

## 2019-04-30 DIAGNOSIS — Z79899 Other long term (current) drug therapy: Secondary | ICD-10-CM | POA: Diagnosis not present

## 2019-06-18 DIAGNOSIS — E119 Type 2 diabetes mellitus without complications: Secondary | ICD-10-CM | POA: Diagnosis not present

## 2019-06-18 DIAGNOSIS — M2550 Pain in unspecified joint: Secondary | ICD-10-CM | POA: Diagnosis not present

## 2019-06-18 DIAGNOSIS — R5383 Other fatigue: Secondary | ICD-10-CM | POA: Diagnosis not present

## 2019-06-18 DIAGNOSIS — G609 Hereditary and idiopathic neuropathy, unspecified: Secondary | ICD-10-CM | POA: Diagnosis not present

## 2019-06-29 DIAGNOSIS — N281 Cyst of kidney, acquired: Secondary | ICD-10-CM | POA: Diagnosis not present

## 2019-06-29 DIAGNOSIS — G8929 Other chronic pain: Secondary | ICD-10-CM | POA: Diagnosis not present

## 2019-06-29 DIAGNOSIS — Z79899 Other long term (current) drug therapy: Secondary | ICD-10-CM | POA: Diagnosis not present

## 2019-06-29 DIAGNOSIS — R109 Unspecified abdominal pain: Secondary | ICD-10-CM | POA: Diagnosis not present

## 2019-06-29 DIAGNOSIS — G894 Chronic pain syndrome: Secondary | ICD-10-CM | POA: Diagnosis not present

## 2019-07-06 DIAGNOSIS — R1084 Generalized abdominal pain: Secondary | ICD-10-CM | POA: Diagnosis not present

## 2019-07-06 DIAGNOSIS — N281 Cyst of kidney, acquired: Secondary | ICD-10-CM | POA: Diagnosis not present

## 2019-07-06 DIAGNOSIS — G8929 Other chronic pain: Secondary | ICD-10-CM | POA: Diagnosis not present

## 2019-07-06 DIAGNOSIS — G894 Chronic pain syndrome: Secondary | ICD-10-CM | POA: Diagnosis not present

## 2019-07-16 DIAGNOSIS — R5383 Other fatigue: Secondary | ICD-10-CM | POA: Diagnosis not present

## 2019-07-16 DIAGNOSIS — R1013 Epigastric pain: Secondary | ICD-10-CM | POA: Diagnosis not present

## 2019-07-16 DIAGNOSIS — G609 Hereditary and idiopathic neuropathy, unspecified: Secondary | ICD-10-CM | POA: Diagnosis not present

## 2019-07-16 DIAGNOSIS — Z79899 Other long term (current) drug therapy: Secondary | ICD-10-CM | POA: Diagnosis not present

## 2019-08-01 DIAGNOSIS — R109 Unspecified abdominal pain: Secondary | ICD-10-CM | POA: Diagnosis not present

## 2019-08-01 DIAGNOSIS — Z79899 Other long term (current) drug therapy: Secondary | ICD-10-CM | POA: Diagnosis not present

## 2019-08-01 DIAGNOSIS — G8929 Other chronic pain: Secondary | ICD-10-CM | POA: Diagnosis not present

## 2019-08-01 DIAGNOSIS — G894 Chronic pain syndrome: Secondary | ICD-10-CM | POA: Diagnosis not present

## 2019-08-01 DIAGNOSIS — N281 Cyst of kidney, acquired: Secondary | ICD-10-CM | POA: Diagnosis not present

## 2019-08-20 DIAGNOSIS — G609 Hereditary and idiopathic neuropathy, unspecified: Secondary | ICD-10-CM | POA: Diagnosis not present

## 2019-08-20 DIAGNOSIS — Z79899 Other long term (current) drug therapy: Secondary | ICD-10-CM | POA: Diagnosis not present

## 2019-08-20 DIAGNOSIS — E119 Type 2 diabetes mellitus without complications: Secondary | ICD-10-CM | POA: Diagnosis not present

## 2019-08-20 DIAGNOSIS — R5383 Other fatigue: Secondary | ICD-10-CM | POA: Diagnosis not present

## 2019-08-29 DIAGNOSIS — Z79899 Other long term (current) drug therapy: Secondary | ICD-10-CM | POA: Diagnosis not present

## 2019-08-29 DIAGNOSIS — N281 Cyst of kidney, acquired: Secondary | ICD-10-CM | POA: Diagnosis not present

## 2019-08-29 DIAGNOSIS — G8929 Other chronic pain: Secondary | ICD-10-CM | POA: Diagnosis not present

## 2019-08-29 DIAGNOSIS — G894 Chronic pain syndrome: Secondary | ICD-10-CM | POA: Diagnosis not present

## 2019-08-29 DIAGNOSIS — R109 Unspecified abdominal pain: Secondary | ICD-10-CM | POA: Diagnosis not present

## 2019-09-17 DIAGNOSIS — R5383 Other fatigue: Secondary | ICD-10-CM | POA: Diagnosis not present

## 2019-09-17 DIAGNOSIS — E119 Type 2 diabetes mellitus without complications: Secondary | ICD-10-CM | POA: Diagnosis not present

## 2019-09-17 DIAGNOSIS — G609 Hereditary and idiopathic neuropathy, unspecified: Secondary | ICD-10-CM | POA: Diagnosis not present

## 2019-09-17 DIAGNOSIS — Z79899 Other long term (current) drug therapy: Secondary | ICD-10-CM | POA: Diagnosis not present

## 2019-10-01 DIAGNOSIS — R5383 Other fatigue: Secondary | ICD-10-CM | POA: Diagnosis not present

## 2019-10-01 DIAGNOSIS — G609 Hereditary and idiopathic neuropathy, unspecified: Secondary | ICD-10-CM | POA: Diagnosis not present

## 2019-10-01 DIAGNOSIS — E1169 Type 2 diabetes mellitus with other specified complication: Secondary | ICD-10-CM | POA: Diagnosis not present

## 2019-10-01 DIAGNOSIS — Z79899 Other long term (current) drug therapy: Secondary | ICD-10-CM | POA: Diagnosis not present

## 2019-10-05 DIAGNOSIS — Z79899 Other long term (current) drug therapy: Secondary | ICD-10-CM | POA: Diagnosis not present

## 2019-10-05 DIAGNOSIS — R109 Unspecified abdominal pain: Secondary | ICD-10-CM | POA: Diagnosis not present

## 2019-10-05 DIAGNOSIS — N281 Cyst of kidney, acquired: Secondary | ICD-10-CM | POA: Diagnosis not present

## 2019-10-05 DIAGNOSIS — G894 Chronic pain syndrome: Secondary | ICD-10-CM | POA: Diagnosis not present

## 2019-10-05 DIAGNOSIS — Z3200 Encounter for pregnancy test, result unknown: Secondary | ICD-10-CM | POA: Diagnosis not present

## 2019-10-05 DIAGNOSIS — G8929 Other chronic pain: Secondary | ICD-10-CM | POA: Diagnosis not present

## 2019-11-02 DIAGNOSIS — N281 Cyst of kidney, acquired: Secondary | ICD-10-CM | POA: Diagnosis not present

## 2019-11-02 DIAGNOSIS — G894 Chronic pain syndrome: Secondary | ICD-10-CM | POA: Diagnosis not present

## 2019-11-02 DIAGNOSIS — G8929 Other chronic pain: Secondary | ICD-10-CM | POA: Diagnosis not present

## 2019-11-02 DIAGNOSIS — Z3200 Encounter for pregnancy test, result unknown: Secondary | ICD-10-CM | POA: Diagnosis not present

## 2019-11-02 DIAGNOSIS — Z79899 Other long term (current) drug therapy: Secondary | ICD-10-CM | POA: Diagnosis not present

## 2019-11-02 DIAGNOSIS — R109 Unspecified abdominal pain: Secondary | ICD-10-CM | POA: Diagnosis not present

## 2019-12-30 DIAGNOSIS — M7989 Other specified soft tissue disorders: Secondary | ICD-10-CM | POA: Diagnosis not present

## 2019-12-30 DIAGNOSIS — F17200 Nicotine dependence, unspecified, uncomplicated: Secondary | ICD-10-CM | POA: Diagnosis not present

## 2020-01-01 DIAGNOSIS — R600 Localized edema: Secondary | ICD-10-CM | POA: Diagnosis not present

## 2020-01-01 DIAGNOSIS — F17200 Nicotine dependence, unspecified, uncomplicated: Secondary | ICD-10-CM | POA: Diagnosis not present

## 2020-01-04 DIAGNOSIS — Z0001 Encounter for general adult medical examination with abnormal findings: Secondary | ICD-10-CM | POA: Diagnosis not present

## 2020-01-04 DIAGNOSIS — R5383 Other fatigue: Secondary | ICD-10-CM | POA: Diagnosis not present

## 2020-01-04 DIAGNOSIS — M2550 Pain in unspecified joint: Secondary | ICD-10-CM | POA: Diagnosis not present

## 2020-01-04 DIAGNOSIS — E119 Type 2 diabetes mellitus without complications: Secondary | ICD-10-CM | POA: Diagnosis not present

## 2020-01-28 DIAGNOSIS — E1140 Type 2 diabetes mellitus with diabetic neuropathy, unspecified: Secondary | ICD-10-CM | POA: Diagnosis not present

## 2020-01-28 DIAGNOSIS — R5383 Other fatigue: Secondary | ICD-10-CM | POA: Diagnosis not present

## 2020-01-28 DIAGNOSIS — R0602 Shortness of breath: Secondary | ICD-10-CM | POA: Diagnosis not present

## 2020-01-28 DIAGNOSIS — Z79899 Other long term (current) drug therapy: Secondary | ICD-10-CM | POA: Diagnosis not present

## 2020-02-15 DIAGNOSIS — R109 Unspecified abdominal pain: Secondary | ICD-10-CM | POA: Diagnosis not present

## 2020-02-15 DIAGNOSIS — G8929 Other chronic pain: Secondary | ICD-10-CM | POA: Diagnosis not present

## 2020-02-15 DIAGNOSIS — E1165 Type 2 diabetes mellitus with hyperglycemia: Secondary | ICD-10-CM | POA: Diagnosis not present

## 2020-02-15 DIAGNOSIS — Z79899 Other long term (current) drug therapy: Secondary | ICD-10-CM | POA: Diagnosis not present

## 2020-02-15 DIAGNOSIS — G894 Chronic pain syndrome: Secondary | ICD-10-CM | POA: Diagnosis not present

## 2020-02-15 DIAGNOSIS — Z3200 Encounter for pregnancy test, result unknown: Secondary | ICD-10-CM | POA: Diagnosis not present

## 2020-05-06 DIAGNOSIS — E669 Obesity, unspecified: Secondary | ICD-10-CM | POA: Diagnosis not present

## 2020-05-06 DIAGNOSIS — R0602 Shortness of breath: Secondary | ICD-10-CM | POA: Diagnosis not present

## 2020-05-06 DIAGNOSIS — E1140 Type 2 diabetes mellitus with diabetic neuropathy, unspecified: Secondary | ICD-10-CM | POA: Diagnosis not present

## 2020-05-06 DIAGNOSIS — R5383 Other fatigue: Secondary | ICD-10-CM | POA: Diagnosis not present

## 2020-05-22 DIAGNOSIS — R5383 Other fatigue: Secondary | ICD-10-CM | POA: Diagnosis not present

## 2020-05-22 DIAGNOSIS — E1140 Type 2 diabetes mellitus with diabetic neuropathy, unspecified: Secondary | ICD-10-CM | POA: Diagnosis not present

## 2020-05-22 DIAGNOSIS — E669 Obesity, unspecified: Secondary | ICD-10-CM | POA: Diagnosis not present

## 2020-05-22 DIAGNOSIS — Z79899 Other long term (current) drug therapy: Secondary | ICD-10-CM | POA: Diagnosis not present
